# Patient Record
Sex: Male | Born: 1960
Health system: Southern US, Community
[De-identification: ages and names within clinical notes are randomized; demographics above are authoritative.]

## PROBLEM LIST (undated history)

## (undated) DIAGNOSIS — K219 Gastro-esophageal reflux disease without esophagitis: Secondary | ICD-10-CM

## (undated) DIAGNOSIS — R011 Cardiac murmur, unspecified: Secondary | ICD-10-CM

## (undated) DIAGNOSIS — S43439A Superior glenoid labrum lesion of unspecified shoulder, initial encounter: Secondary | ICD-10-CM

## (undated) DIAGNOSIS — K2270 Barrett's esophagus without dysplasia: Secondary | ICD-10-CM

## (undated) DIAGNOSIS — I10 Essential (primary) hypertension: Secondary | ICD-10-CM

## (undated) DIAGNOSIS — Z973 Presence of spectacles and contact lenses: Secondary | ICD-10-CM

## (undated) HISTORY — PX: COLONOSCOPY: SHX174

## (undated) HISTORY — PX: UPPER GASTROINTESTINAL ENDOSCOPY: SHX188

## (undated) HISTORY — DX: Cardiac murmur, unspecified: R01.1

## (undated) HISTORY — DX: Barrett's esophagus without dysplasia: K22.70

## (undated) HISTORY — PX: LIPOMA EXCISION: SHX5283

## (undated) HISTORY — PX: TONSILLECTOMY: SUR1361

## (undated) HISTORY — PX: APPENDECTOMY: SHX54

## (undated) HISTORY — DX: Superior glenoid labrum lesion of unspecified shoulder, initial encounter: S43.439A

---

## 1998-11-20 ENCOUNTER — Emergency Department (HOSPITAL_COMMUNITY): Admission: EM | Admit: 1998-11-20 | Discharge: 1998-11-20 | Payer: Self-pay | Admitting: *Deleted

## 2000-06-04 ENCOUNTER — Other Ambulatory Visit: Admission: RE | Admit: 2000-06-04 | Discharge: 2000-06-04 | Payer: Self-pay | Admitting: Urology

## 2001-10-15 ENCOUNTER — Encounter: Payer: Self-pay | Admitting: Emergency Medicine

## 2001-10-15 ENCOUNTER — Emergency Department (HOSPITAL_COMMUNITY): Admission: EM | Admit: 2001-10-15 | Discharge: 2001-10-15 | Payer: Self-pay | Admitting: Emergency Medicine

## 2012-09-18 ENCOUNTER — Ambulatory Visit (INDEPENDENT_AMBULATORY_CARE_PROVIDER_SITE_OTHER): Payer: 59 | Admitting: General Surgery

## 2012-09-18 ENCOUNTER — Encounter (INDEPENDENT_AMBULATORY_CARE_PROVIDER_SITE_OTHER): Payer: Self-pay | Admitting: General Surgery

## 2012-09-18 VITALS — BP 134/84 | HR 62 | Temp 97.5°F | Resp 16 | Ht 75.0 in | Wt 229.0 lb

## 2012-09-18 DIAGNOSIS — D170 Benign lipomatous neoplasm of skin and subcutaneous tissue of head, face and neck: Secondary | ICD-10-CM | POA: Insufficient documentation

## 2012-09-18 DIAGNOSIS — D1739 Benign lipomatous neoplasm of skin and subcutaneous tissue of other sites: Secondary | ICD-10-CM

## 2012-09-18 NOTE — Progress Notes (Signed)
Patient ID: Christian Nunez, male   DOB: 19-Nov-1960, 52 y.o.   MRN: [id number]  Chief Complaint  Patient presents with  . New Evaluation    eval cyst on back    HPI [name] is a 52 y.o. male.   HPI  He is self-referred with a new problem. He had a previous lipoma taken off of his neck. He is felt a small nodule in that same area that is progressively enlarging. No pain.  Past Medical History  Diagnosis Date  . Heart murmur     Past Surgical History  Procedure Laterality Date  . Tonsillectomy    . Appendectomy      History reviewed. No pertinent family history.  Social History History  Substance Use Topics  . Smoking status: Never Smoker   . Smokeless tobacco: Not on file  . Alcohol Use: Yes     Comment: 2 week    No Known Allergies  Current Outpatient Prescriptions  Medication Sig Dispense Refill  . omeprazole (PRILOSEC) 20 MG capsule Take 20 mg by mouth daily.       No current facility-administered medications for this visit.    Review of Systems Review of Systems  Constitutional:       He has been exercising and has lost some weight.  Respiratory: Negative.   Cardiovascular: Negative.     Blood pressure 134/84, pulse 62, temperature 97.5 F (36.4 C), temperature source Temporal, resp. rate 16, height 6\' 3"  (1.905 m), weight 229 lb (103.874 kg), SpO2 98.00%.  Physical Exam Physical Exam  Constitutional: He appears well-developed and well-nourished. No distress.  Neck:  Small scar in upper back/left inferior neck. At the medial aspect of the scar is a 1 cm movable nodule. No erythema there.  Lymphadenopathy:    He has no cervical adenopathy.    Data Reviewed Old note and pathology report.  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     Plan    Excision under local anesthesia. The procedure and risks were discussed with him. Risks include but not limited to bleeding, infection, would problems, indentation at the site. He  seemed to understand and agrees with the plan.        Choice Kleinsasser J 09/18/2012, 12:30 PM

## 2012-09-18 NOTE — Patient Instructions (Signed)
May do most activities as tolerated after procedure.

## 2012-10-03 ENCOUNTER — Other Ambulatory Visit (INDEPENDENT_AMBULATORY_CARE_PROVIDER_SITE_OTHER): Payer: Self-pay | Admitting: *Deleted

## 2012-10-03 ENCOUNTER — Other Ambulatory Visit (INDEPENDENT_AMBULATORY_CARE_PROVIDER_SITE_OTHER): Payer: Self-pay | Admitting: General Surgery

## 2012-10-03 DIAGNOSIS — D1739 Benign lipomatous neoplasm of skin and subcutaneous tissue of other sites: Secondary | ICD-10-CM

## 2012-10-03 MED ORDER — TRAMADOL HCL 50 MG PO TABS: 50.0000 mg | ORAL_TABLET | Freq: Four times a day (QID) | ORAL | Status: DC | PRN

## 2012-10-25 ENCOUNTER — Encounter (INDEPENDENT_AMBULATORY_CARE_PROVIDER_SITE_OTHER): Payer: 59 | Admitting: General Surgery

## 2012-11-04 ENCOUNTER — Encounter (INDEPENDENT_AMBULATORY_CARE_PROVIDER_SITE_OTHER): Payer: Self-pay | Admitting: General Surgery

## 2012-12-30 ENCOUNTER — Encounter (HOSPITAL_BASED_OUTPATIENT_CLINIC_OR_DEPARTMENT_OTHER): Payer: Self-pay | Admitting: *Deleted

## 2012-12-30 NOTE — Progress Notes (Signed)
No labs needed

## 2013-01-07 ENCOUNTER — Ambulatory Visit (HOSPITAL_BASED_OUTPATIENT_CLINIC_OR_DEPARTMENT_OTHER): Payer: 59 | Admitting: Anesthesiology

## 2013-01-07 ENCOUNTER — Encounter (HOSPITAL_BASED_OUTPATIENT_CLINIC_OR_DEPARTMENT_OTHER): Payer: Self-pay

## 2013-01-07 ENCOUNTER — Encounter (HOSPITAL_BASED_OUTPATIENT_CLINIC_OR_DEPARTMENT_OTHER)
Admission: RE | Disposition: A | Discharge status: Home / Self Care | Payer: Self-pay | Source: Ambulatory Visit | Attending: Orthopedic Surgery

## 2013-01-07 ENCOUNTER — Other Ambulatory Visit: Payer: Self-pay | Admitting: Physician Assistant

## 2013-01-07 ENCOUNTER — Encounter: Payer: Self-pay | Admitting: Physician Assistant

## 2013-01-07 ENCOUNTER — Encounter (HOSPITAL_BASED_OUTPATIENT_CLINIC_OR_DEPARTMENT_OTHER): Payer: 59 | Admitting: Anesthesiology

## 2013-01-07 ENCOUNTER — Ambulatory Visit (HOSPITAL_BASED_OUTPATIENT_CLINIC_OR_DEPARTMENT_OTHER)
Admission: RE | Admit: 2013-01-07 | Discharge: 2013-01-07 | Disposition: A | Discharge status: Home / Self Care | Payer: 59 | Source: Ambulatory Visit | Attending: Orthopedic Surgery | Admitting: Orthopedic Surgery

## 2013-01-07 DIAGNOSIS — X500XXA Overexertion from strenuous movement or load, initial encounter: Secondary | ICD-10-CM | POA: Insufficient documentation

## 2013-01-07 DIAGNOSIS — S43439A Superior glenoid labrum lesion of unspecified shoulder, initial encounter: Secondary | ICD-10-CM | POA: Insufficient documentation

## 2013-01-07 DIAGNOSIS — S43432D Superior glenoid labrum lesion of left shoulder, subsequent encounter: Secondary | ICD-10-CM

## 2013-01-07 DIAGNOSIS — K219 Gastro-esophageal reflux disease without esophagitis: Secondary | ICD-10-CM | POA: Diagnosis present

## 2013-01-07 DIAGNOSIS — R011 Cardiac murmur, unspecified: Secondary | ICD-10-CM | POA: Diagnosis present

## 2013-01-07 DIAGNOSIS — S43431D Superior glenoid labrum lesion of right shoulder, subsequent encounter: Secondary | ICD-10-CM

## 2013-01-07 DIAGNOSIS — M9420 Chondromalacia, unspecified site: Secondary | ICD-10-CM | POA: Insufficient documentation

## 2013-01-07 DIAGNOSIS — Y93B9 Activity, other involving muscle strengthening exercises: Secondary | ICD-10-CM | POA: Insufficient documentation

## 2013-01-07 DIAGNOSIS — S43429A Sprain of unspecified rotator cuff capsule, initial encounter: Secondary | ICD-10-CM | POA: Insufficient documentation

## 2013-01-07 HISTORY — DX: Gastro-esophageal reflux disease without esophagitis: K21.9

## 2013-01-07 HISTORY — DX: Presence of spectacles and contact lenses: Z97.3

## 2013-01-07 HISTORY — PX: SHOULDER ARTHROSCOPY WITH LABRAL REPAIR: SHX5691

## 2013-01-07 SURGERY — ARTHROSCOPY, SHOULDER, WITH GLENOID LABRUM REPAIR
Anesthesia: Regional | Site: Shoulder | Laterality: Right

## 2013-01-07 MED ORDER — MIDAZOLAM HCL 2 MG/2ML IJ SOLN
1.0000 mg | INTRAMUSCULAR | Status: DC | PRN
  Administered 2013-01-07: 2 mg via INTRAVENOUS

## 2013-01-07 MED ORDER — MIDAZOLAM HCL 2 MG/2ML IJ SOLN
INTRAMUSCULAR | Status: AC
  Filled 2013-01-07: qty 2

## 2013-01-07 MED ORDER — SUCCINYLCHOLINE CHLORIDE 20 MG/ML IJ SOLN
INTRAMUSCULAR | Status: DC | PRN
  Administered 2013-01-07: 100 mg via INTRAVENOUS

## 2013-01-07 MED ORDER — FENTANYL CITRATE 0.05 MG/ML IJ SOLN
INTRAMUSCULAR | Status: AC
  Filled 2013-01-07: qty 2

## 2013-01-07 MED ORDER — EPINEPHRINE HCL 1 MG/ML IJ SOLN
INTRAMUSCULAR | Status: AC
  Filled 2013-01-07: qty 1

## 2013-01-07 MED ORDER — DEXAMETHASONE SODIUM PHOSPHATE 4 MG/ML IJ SOLN
INTRAMUSCULAR | Status: DC | PRN
  Administered 2013-01-07: 10 mg via INTRAVENOUS

## 2013-01-07 MED ORDER — EPINEPHRINE HCL 1 MG/ML IJ SOLN
INTRAMUSCULAR | Status: DC | PRN
  Administered 2013-01-07: 2 mL

## 2013-01-07 MED ORDER — BUPIVACAINE-EPINEPHRINE PF 0.5-1:200000 % IJ SOLN
INTRAMUSCULAR | Status: DC | PRN
  Administered 2013-01-07: 30 mL via PERINEURAL

## 2013-01-07 MED ORDER — ONDANSETRON HCL 4 MG/2ML IJ SOLN
INTRAMUSCULAR | Status: DC | PRN
  Administered 2013-01-07: 4 mg via INTRAVENOUS

## 2013-01-07 MED ORDER — FENTANYL CITRATE 0.05 MG/ML IJ SOLN
INTRAMUSCULAR | Status: AC
  Filled 2013-01-07: qty 6

## 2013-01-07 MED ORDER — DIAZEPAM 2 MG PO TABS: 2.0000 mg | ORAL_TABLET | Freq: Four times a day (QID) | ORAL | Status: DC | PRN

## 2013-01-07 MED ORDER — CEFAZOLIN SODIUM-DEXTROSE 2-3 GM-% IV SOLR
2.0000 g | INTRAVENOUS | Status: AC
  Administered 2013-01-07: 2 g via INTRAVENOUS

## 2013-01-07 MED ORDER — FENTANYL CITRATE 0.05 MG/ML IJ SOLN
50.0000 ug | INTRAMUSCULAR | Status: DC | PRN
  Administered 2013-01-07: 100 ug via INTRAVENOUS

## 2013-01-07 MED ORDER — BUPIVACAINE-EPINEPHRINE PF 0.25-1:200000 % IJ SOLN
INTRAMUSCULAR | Status: AC
  Filled 2013-01-07: qty 30

## 2013-01-07 MED ORDER — OXYCODONE HCL 5 MG PO TABS: 5.0000 mg | ORAL_TABLET | Freq: Once | ORAL | Status: DC | PRN

## 2013-01-07 MED ORDER — OXYCODONE HCL 5 MG/5ML PO SOLN: 5.0000 mg | Freq: Once | ORAL | Status: DC | PRN

## 2013-01-07 MED ORDER — CHLORHEXIDINE GLUCONATE 4 % EX LIQD: 60.0000 mL | Freq: Once | CUTANEOUS | Status: DC

## 2013-01-07 MED ORDER — PROPOFOL 10 MG/ML IV BOLUS
INTRAVENOUS | Status: DC | PRN
  Administered 2013-01-07: 250 mg via INTRAVENOUS

## 2013-01-07 MED ORDER — LIDOCAINE HCL (CARDIAC) 20 MG/ML IV SOLN
INTRAVENOUS | Status: DC | PRN
  Administered 2013-01-07: 75 mg via INTRAVENOUS

## 2013-01-07 MED ORDER — FENTANYL CITRATE 0.05 MG/ML IJ SOLN
INTRAMUSCULAR | Status: DC | PRN
  Administered 2013-01-07 (×2): 50 ug via INTRAVENOUS

## 2013-01-07 MED ORDER — OXYCODONE HCL 5 MG PO TABS: ORAL_TABLET | ORAL | Status: DC

## 2013-01-07 MED ORDER — SODIUM CHLORIDE 0.9 % IR SOLN
Status: DC | PRN
  Administered 2013-01-07: 6000 mL

## 2013-01-07 MED ORDER — LACTATED RINGERS IV SOLN
INTRAVENOUS | Status: DC
  Administered 2013-01-07 (×2): via INTRAVENOUS

## 2013-01-07 MED ORDER — HYDROMORPHONE HCL PF 1 MG/ML IJ SOLN: 0.2500 mg | INTRAMUSCULAR | Status: DC | PRN

## 2013-01-07 SURGICAL SUPPLY — 83 items
ANCH SUT SHRT 12.5 CANN EYLT (Anchor) ×6 IMPLANT
ANCHOR SUT BIOCOMP LK 2.9X12.5 (Anchor) ×6 IMPLANT
APL SKNCLS STERI-STRIP NONHPOA (GAUZE/BANDAGES/DRESSINGS)
BENZOIN TINCTURE PRP APPL 2/3 (GAUZE/BANDAGES/DRESSINGS) IMPLANT
BLADE CUDA 5.5 (BLADE) IMPLANT
BLADE CUTTER GATOR 3.5 (BLADE) ×3 IMPLANT
BLADE GREAT WHITE 4.2 (BLADE) IMPLANT
BLADE SURG 15 STRL LF DISP TIS (BLADE) IMPLANT
BLADE SURG 15 STRL SS (BLADE)
BLADE SURG ROTATE 9660 (MISCELLANEOUS) IMPLANT
BNDG COHESIVE 4X5 TAN STRL (GAUZE/BANDAGES/DRESSINGS) ×3 IMPLANT
BUR OVAL 6.0 (BURR) IMPLANT
CANISTER SUCT 3000ML (MISCELLANEOUS) IMPLANT
CANISTER SUCT LVC 12 LTR MEDI- (MISCELLANEOUS) ×3 IMPLANT
CANNULA TWIST IN 8.25X7CM (CANNULA) IMPLANT
DECANTER SPIKE VIAL GLASS SM (MISCELLANEOUS) IMPLANT
DRAPE SHOULDER BEACH CHAIR (DRAPES) ×3 IMPLANT
DRAPE U-SHAPE 47X51 STRL (DRAPES) ×6 IMPLANT
DRSG PAD ABDOMINAL 8X10 ST (GAUZE/BANDAGES/DRESSINGS) ×3 IMPLANT
DURAPREP 26ML APPLICATOR (WOUND CARE) ×3 IMPLANT
ELECT REM PT RETURN 9FT ADLT (ELECTROSURGICAL) ×3
ELECTRODE REM PT RTRN 9FT ADLT (ELECTROSURGICAL) ×2 IMPLANT
GAUZE XEROFORM 1X8 LF (GAUZE/BANDAGES/DRESSINGS) ×3 IMPLANT
GLOVE BIO SURGEON STRL SZ 6.5 (GLOVE) ×2 IMPLANT
GLOVE BIO SURGEON STRL SZ7 (GLOVE) IMPLANT
GLOVE BIOGEL PI IND STRL 7.0 (GLOVE) ×1 IMPLANT
GLOVE BIOGEL PI IND STRL 7.5 (GLOVE) ×2 IMPLANT
GLOVE BIOGEL PI INDICATOR 7.0 (GLOVE) ×1
GLOVE BIOGEL PI INDICATOR 7.5 (GLOVE) ×1
GLOVE SS BIOGEL STRL SZ 7.5 (GLOVE) ×2 IMPLANT
GLOVE SUPERSENSE BIOGEL SZ 7.5 (GLOVE) ×1
GOWN PREVENTION PLUS XLARGE (GOWN DISPOSABLE) ×3 IMPLANT
KIT PUSHLOCK 2.9 HIP (KITS) ×2 IMPLANT
LASSO CRESCENT QUICKPASS (SUTURE) ×3 IMPLANT
LASSO SUT 90 DEGREE (SUTURE) IMPLANT
LOOP 2 FIBERLINK CLOSED (SUTURE) IMPLANT
NDL 1/2 CIR CATGUT .05X1.09 (NEEDLE) IMPLANT
NDL SAFETY ECLIPSE 18X1.5 (NEEDLE) ×2 IMPLANT
NDL SUT 6 .5 CRC .975X.05 MAYO (NEEDLE) IMPLANT
NEEDLE 1/2 CIR CATGUT .05X1.09 (NEEDLE) IMPLANT
NEEDLE HYPO 18GX1.5 SHARP (NEEDLE) ×3
NEEDLE MAYO TAPER (NEEDLE)
PACK ARTHROSCOPY DSU (CUSTOM PROCEDURE TRAY) ×3 IMPLANT
PACK BASIN DAY SURGERY FS (CUSTOM PROCEDURE TRAY) ×3 IMPLANT
PAD ALCOHOL SWAB (MISCELLANEOUS) ×6 IMPLANT
PENCIL BUTTON HOLSTER BLD 10FT (ELECTRODE) IMPLANT
SET ARTHROSCOPY TUBING (MISCELLANEOUS) ×3
SET ARTHROSCOPY TUBING LN (MISCELLANEOUS) ×2 IMPLANT
SHEET MEDIUM DRAPE 40X70 STRL (DRAPES) IMPLANT
SLEEVE SCD COMPRESS KNEE MED (MISCELLANEOUS) IMPLANT
SLING ARM FOAM STRAP LRG (SOFTGOODS) IMPLANT
SLING ARM FOAM STRAP MED (SOFTGOODS) IMPLANT
SLING ARM FOAM STRAP XLG (SOFTGOODS) IMPLANT
SLING ARM IMMOBILIZER MED (SOFTGOODS) IMPLANT
SLING ULTRA III MED (ORTHOPEDIC SUPPLIES) IMPLANT
SPONGE GAUZE 4X4 12PLY (GAUZE/BANDAGES/DRESSINGS) ×3 IMPLANT
SPONGE LAP 4X18 X RAY DECT (DISPOSABLE) IMPLANT
STRIP CLOSURE SKIN 1/2X4 (GAUZE/BANDAGES/DRESSINGS) IMPLANT
SUCTION FRAZIER TIP 10 FR DISP (SUCTIONS) IMPLANT
SUT ETHILON 3 0 PS 1 (SUTURE) ×3 IMPLANT
SUT FIBERWIRE #2 38 T-5 BLUE (SUTURE)
SUT LASSO 45 DEG R (SUTURE) IMPLANT
SUT LASSO 45 DEGREE LEFT (SUTURE) IMPLANT
SUT LASSO MICRO STR (SUTURE) ×2 IMPLANT
SUT PDS AB 2-0 CT2 27 (SUTURE) IMPLANT
SUT PROLENE 3 0 PS 2 (SUTURE) IMPLANT
SUT TIGER TAPE 7 IN WHITE (SUTURE) IMPLANT
SUT VIC AB 0 SH 27 (SUTURE) IMPLANT
SUT VIC AB 2-0 PS2 27 (SUTURE) IMPLANT
SUT VIC AB 2-0 SH 27 (SUTURE)
SUT VIC AB 2-0 SH 27XBRD (SUTURE) IMPLANT
SUTURE FIBERWR #2 38 T-5 BLUE (SUTURE) IMPLANT
SYR 20CC LL (SYRINGE) IMPLANT
SYR 5ML LL (SYRINGE) ×3 IMPLANT
SYR BULB 3OZ (MISCELLANEOUS) IMPLANT
TAPE FIBER 2MM 7IN #2 BLUE (SUTURE) IMPLANT
TAPE HYPAFIX 6X30 (GAUZE/BANDAGES/DRESSINGS) IMPLANT
TAPE LABRALWHITE 1.5X36 (TAPE) ×6 IMPLANT
TAPE STRIPS DRAPE STRL (GAUZE/BANDAGES/DRESSINGS) ×3 IMPLANT
TOWEL OR 17X24 6PK STRL BLUE (TOWEL DISPOSABLE) ×3 IMPLANT
TUBE CONNECTING 20X1/4 (TUBING) IMPLANT
WAND STAR VAC 90 (SURGICAL WAND) IMPLANT
WATER STERILE IRR 1000ML POUR (IV SOLUTION) ×3 IMPLANT

## 2013-01-07 NOTE — Anesthesia Postprocedure Evaluation (Signed)
  Anesthesia Post-op Note  Patient: Christian Nunez  Procedure(s) Performed: Procedure(s): SHOULDER ARTHROSCOPY WITH LABRAL REPAIR AND PARTIAL ROTATOR CUFF DEBRIDMENT (Right)  Patient Location: PACU  Anesthesia Type:GA combined with regional for post-op pain  Level of Consciousness: awake  Airway and Oxygen Therapy: Patient Spontanous Breathing  Post-op Pain: mild  Post-op Assessment: Post-op Vital signs reviewed, Patient's Cardiovascular Status Stable, Respiratory Function Stable, Patent Airway, No signs of Nausea or vomiting and Pain level controlled  Post-op Vital Signs: Reviewed and stable  Complications: No apparent anesthesia complications

## 2013-01-07 NOTE — Anesthesia Preprocedure Evaluation (Addendum)
Anesthesia Evaluation  Patient identified by MRN, date of birth, ID band Patient awake    Reviewed: Allergy & Precautions, H&P , NPO status , Patient's Chart, lab work & pertinent test results  Airway Mallampati: II TM Distance: >3 FB Neck ROM: Full    Dental no notable dental hx. (+) Teeth Intact and Dental Advisory Given   Pulmonary neg pulmonary ROS,  breath sounds clear to auscultation  Pulmonary exam normal       Cardiovascular negative cardio ROS  Rhythm:Regular Rate:Normal     Neuro/Psych negative neurological ROS  negative psych ROS   GI/Hepatic Neg liver ROS, GERD-  Medicated and Controlled,  Endo/Other  negative endocrine ROS  Renal/GU negative Renal ROS  negative genitourinary   Musculoskeletal   Abdominal   Peds  Hematology negative hematology ROS (+)   Anesthesia Other Findings   Reproductive/Obstetrics negative OB ROS                          Anesthesia Physical Anesthesia Plan  ASA: II  Anesthesia Plan: General and Regional   Post-op Pain Management:    Induction: Intravenous  Airway Management Planned: Oral ETT  Additional Equipment:   Intra-op Plan:   Post-operative Plan: Extubation in OR  Informed Consent: I have reviewed the patients History and Physical, chart, labs and discussed the procedure including the risks, benefits and alternatives for the proposed anesthesia with the patient or authorized representative who has indicated his/her understanding and acceptance.   Dental advisory given  Plan Discussed with: CRNA and Surgeon  Anesthesia Plan Comments:         Anesthesia Quick Evaluation

## 2013-01-07 NOTE — Interval H&P Note (Signed)
History and Physical Interval Note:  01/07/2013 12:05 PM  Christian Nunez  has presented today for surgery, with the diagnosis of RIGHT SHOULDER IMPINGEMENT SYNDROME, SPRAIN/STRAIN/TEAR SLAP LESION, BICIPITAL TENOSYNOVITIS  The various methods of treatment have been discussed with the patient and family. After consideration of risks, benefits and other options for treatment, the patient has consented to  Procedure(s) with comments: RIGHT ARTHROSCOPY SHOULDER DEBRIDEMENT EXTENTSIVE, ARTHROSCOPY SHOULDER WITH BICEP TENODESIS (Right) - ANESTHESIA: GENERAL, PRE/POST OP SCALENE as a surgical intervention .  The patient's history has been reviewed, patient examined, no change in status, stable for surgery.  I have reviewed the patient's chart and labs.  Questions were answered to the patient's satisfaction.     Salvatore Marvel A

## 2013-01-07 NOTE — Transfer of Care (Signed)
Immediate Anesthesia Transfer of Care Note  Patient: Christian Nunez  Procedure(s) Performed: Procedure(s): SHOULDER ARTHROSCOPY WITH LABRAL REPAIR AND PARTIAL ROTATOR CUFF DEBRIDMENT (Right)  Patient Location: PACU  Anesthesia Type:General and Regional  Level of Consciousness: awake and alert   Airway & Oxygen Therapy: Patient Spontanous Breathing and Patient connected to face mask oxygen  Post-op Assessment: Report given to PACU RN and Post -op Vital signs reviewed and stable  Post vital signs: Reviewed and stable  Complications: No apparent anesthesia complications

## 2013-01-07 NOTE — Progress Notes (Signed)
Assisted Dr. Fitzgerald with right, ultrasound guided, supraclavicular block. Side rails up, monitors on throughout procedure. See vital signs in flow sheet. Tolerated Procedure well. 

## 2013-01-07 NOTE — Anesthesia Procedure Notes (Addendum)
Anesthesia Regional Block:  Interscalene brachial plexus block  Pre-Anesthetic Checklist: ,, timeout performed, Correct Patient, Correct Site, Correct Laterality, Correct Procedure, Correct Position, site marked, Risks and benefits discussed, pre-op evaluation,  At surgeon's request and post-op pain management  Laterality: Right  Prep: Maximum Sterile Barrier Precautions used and chloraprep       Needles:  Injection technique: Single-shot  Needle Type: Echogenic Stimulator Needle     Needle Length: 5cm 5 cm Needle Gauge: 22 and 22 G    Additional Needles:  Procedures: ultrasound guided (picture in chart) and nerve stimulator Interscalene brachial plexus block  Nerve Stimulator or Paresthesia:  Response: Biceps response,   Additional Responses:   Narrative:  Start time: 01/07/2013 11:30 AM End time: 01/07/2013 11:40 AM Injection made incrementally with aspirations every 5 mL. Anesthesiologist: Sampson Goon, MD  Additional Notes: 2% Lidocaine skin wheel.    Procedure Name: Intubation Date/Time: 01/07/2013 12:17 PM Performed by: Zenia Resides D Pre-anesthesia Checklist: Patient identified, Emergency Drugs available, Suction available and Patient being monitored Patient Re-evaluated:Patient Re-evaluated prior to inductionOxygen Delivery Method: Circle System Utilized Preoxygenation: Pre-oxygenation with 100% oxygen Intubation Type: IV induction Ventilation: Mask ventilation without difficulty Laryngoscope Size: Mac and 3 Grade View: Grade I Tube type: Oral Tube size: 7.0 mm Number of attempts: 1 Airway Equipment and Method: stylet and oral airway Placement Confirmation: ETT inserted through vocal cords under direct vision,  positive ETCO2 and breath sounds checked- equal and bilateral Secured at: 23 cm Tube secured with: Tape Dental Injury: Teeth and Oropharynx as per pre-operative assessment

## 2013-01-07 NOTE — H&P (Signed)
Christian Nunez is an 52 y.o. male.   Chief Complaint: right shoulder pain  HPI: Christian Nunez is a 52 year old seen for evaluation of a right shoulder injury that occurred while doing cross fit exercise. He had some pain while doing upper lifting and it got much worse 2 days later and he has pain with any upper body lifting. We injected his shoulder two month ago which only helped for 2-3 days and then pain recurred. He has night pain and pain during the day as well. He cannot sleep because of the pain. He has been on Mobic with minimal relief. MRI arthrogram that revealed a large SLAP tear  Past Medical History  Diagnosis Date  . Wears glasses     reading  . Heart murmur     echo in college-do not hear now  . GERD (gastroesophageal reflux disease)   . SLAP (superior labrum from anterior to posterior) tear     Past Surgical History  Procedure Laterality Date  . Tonsillectomy    . Appendectomy    . Lipoma excision      No family history on file. Social History:  reports that he has never smoked. He does not have any smokeless tobacco history on file. He reports that he drinks alcohol. He reports that he does not use illicit drugs.  Allergies: No Known Allergies  Current Outpatient Prescriptions on File Prior to Visit  Medication Sig Dispense Refill  . HYDROcodone-acetaminophen (NORCO/VICODIN) 5-325 MG per tablet Take 1 tablet by mouth every 6 (six) hours as needed for moderate pain.      Marland Kitchen omeprazole (PRILOSEC) 20 MG capsule Take 20 mg by mouth daily.       No current facility-administered medications on file prior to visit.    (Not in a hospital admission)  No results found for this or any previous visit (from the past 48 hour(s)). No results found.  Review of Systems  Constitutional: Negative.   HENT: Negative.   Eyes: Negative.        Wears glasses  Respiratory: Negative.   Cardiovascular: Negative.   Gastrointestinal: Negative.   Genitourinary: Negative.   Musculoskeletal:  Positive for joint pain.       Right shoulder pain  Skin: Negative.   Neurological: Negative.   Endo/Heme/Allergies: Negative.   Psychiatric/Behavioral: Negative.     Height 6\' 4"  (1.93 m), weight 98.884 kg (218 lb). Physical Exam  Constitutional: He is oriented to person, place, and time. He appears well-developed and well-nourished.  HENT:  Head: Normocephalic and atraumatic.  Mouth/Throat: Oropharynx is clear and moist.  Eyes: Conjunctivae are normal. Pupils are equal, round, and reactive to light.  Neck: Neck supple.  Cardiovascular: Normal rate and regular rhythm.   Murmur heard. Respiratory: Effort normal.  GI: Soft.  Genitourinary:  Not pertinent to current symptomatology therefore not examined.  Musculoskeletal:  Examination of his right shoulder reveals pain anteriorly and laterally full range of motion pain on rotator cuff stressing but minimal weakness no instability. Mildly positive O'Brien's test. Exam of the left shoulder reveals full range of motion without pain weakness or instability. Vascular exam: pulses 2+ and symmetric.  Neurological: He is alert and oriented to person, place, and time.  Skin: Skin is warm and dry.  Psychiatric: He has a normal mood and affect. His behavior is normal.     Assessment Patient Active Problem List   Diagnosis Date Noted  . Heart murmur   . GERD (gastroesophageal reflux disease)   .  SLAP (superior labrum from anterior to posterior) tear   . Lipoma of neck 09/18/2012    Plan I spoke to [facility] concerning his right shoulder MRI arthrogram that revealed a large SLAP tear. I told him with this finding and his significant pain that I recommend we proceed with a right shoulder arthroscopy with debridement versus biceps tenodesis. Discussed risks benefits and possible complications of the surgery in detail and he understands this completely.   Christian Nunez 01/07/2013, 10:01 AM

## 2013-01-07 NOTE — H&P (View-Only) (Signed)
Christian Nunez is an 52 y.o. male.   Chief Complaint: right shoulder pain  HPI: Christian Nunez is a 52 year old seen for evaluation of a right shoulder injury that occurred while doing cross fit exercise. He had some pain while doing upper lifting and it got much worse 2 days later and he has pain with any upper body lifting. We injected his shoulder two month ago which only helped for 2-3 days and then pain recurred. He has night pain and pain during the day as well. He cannot sleep because of the pain. He has been on Mobic with minimal relief. MRI arthrogram that revealed a large SLAP tear  Past Medical History  Diagnosis Date  . Wears glasses     reading  . Heart murmur     echo in college-do not hear now  . GERD (gastroesophageal reflux disease)   . SLAP (superior labrum from anterior to posterior) tear     Past Surgical History  Procedure Laterality Date  . Tonsillectomy    . Appendectomy    . Lipoma excision      No family history on file. Social History:  reports that he has never smoked. He does not have any smokeless tobacco history on file. He reports that he drinks alcohol. He reports that he does not use illicit drugs.  Allergies: No Known Allergies  Current Outpatient Prescriptions on File Prior to Visit  Medication Sig Dispense Refill  . HYDROcodone-acetaminophen (NORCO/VICODIN) 5-325 MG per tablet Take 1 tablet by mouth every 6 (six) hours as needed for moderate pain.      . omeprazole (PRILOSEC) 20 MG capsule Take 20 mg by mouth daily.       No current facility-administered medications on file prior to visit.    (Not in a hospital admission)  No results found for this or any previous visit (from the past 48 hour(s)). No results found.  Review of Systems  Constitutional: Negative.   HENT: Negative.   Eyes: Negative.        Wears glasses  Respiratory: Negative.   Cardiovascular: Negative.   Gastrointestinal: Negative.   Genitourinary: Negative.   Musculoskeletal:  Positive for joint pain.       Right shoulder pain  Skin: Negative.   Neurological: Negative.   Endo/Heme/Allergies: Negative.   Psychiatric/Behavioral: Negative.     Height 6' 4" (1.93 m), weight 98.884 kg (218 lb). Physical Exam  Constitutional: He is oriented to person, place, and time. He appears well-developed and well-nourished.  HENT:  Head: Normocephalic and atraumatic.  Mouth/Throat: Oropharynx is clear and moist.  Eyes: Conjunctivae are normal. Pupils are equal, round, and reactive to light.  Neck: Neck supple.  Cardiovascular: Normal rate and regular rhythm.   Murmur heard. Respiratory: Effort normal.  GI: Soft.  Genitourinary:  Not pertinent to current symptomatology therefore not examined.  Musculoskeletal:  Examination of his right shoulder reveals pain anteriorly and laterally full range of motion pain on rotator cuff stressing but minimal weakness no instability. Mildly positive O'Brien's test. Exam of the left shoulder reveals full range of motion without pain weakness or instability. Vascular exam: pulses 2+ and symmetric.  Neurological: He is alert and oriented to person, place, and time.  Skin: Skin is warm and dry.  Psychiatric: He has a normal mood and affect. His behavior is normal.     Assessment Patient Active Problem List   Diagnosis Date Noted  . Heart murmur   . GERD (gastroesophageal reflux disease)   .   SLAP (superior labrum from anterior to posterior) tear   . Lipoma of neck 09/18/2012    Plan I spoke to Jionni concerning his right shoulder MRI arthrogram that revealed a large SLAP tear. I told him with this finding and his significant pain that I recommend we proceed with a right shoulder arthroscopy with debridement versus biceps tenodesis. Discussed risks benefits and possible complications of the surgery in detail and he understands this completely.   Demarquez Ciolek J 01/07/2013, 10:01 AM    

## 2013-01-09 NOTE — Op Note (Signed)
NAMESANTI, TROUNG               ACCOUNT NO.:  [id number]  MEDICAL RECORD NO.:  [id number]  LOCATION:                               FACILITY:  [facility]  PHYSICIAN:  Albirtha Grinage A. Thurston Hole, M.D. DATE OF BIRTH:  September 19, 1960  DATE OF PROCEDURE:  01/07/2013 DATE OF DISCHARGE:  01/07/2013                              OPERATIVE REPORT   PREOPERATIVE DIAGNOSES: 1. Right shoulder posterior labrum tear. 2. Right shoulder superior labrum anterior and posterior tear. 3. Right shoulder chondromalacia. 4. Right shoulder partial rotator cuff tear.  POSTOPERATIVE DIAGNOSES: 1. Right shoulder posterior labrum tear. 2. Right shoulder superior labrum anterior and posterior tear. 3. Right shoulder chondromalacia. 4. Right shoulder partial rotator cuff tear.  PROCEDURE: 1. Right shoulder EUA followed by arthroscopically-assisted posterior     labrum repair using Arthrex PushLock anchors x2. 2. Right shoulder posterior SLAP repair using Arthrex PushLock anchor     x1. 3. Right shoulder chondroplasty. 4. Right shoulder partial rotator cuff tear debridement.  SURGEON:  Elana Alm. Thurston Hole, M.D.  ASSISTANT:  Kirstin Shepperson, PA-C.  ANESTHESIA:  General.  OPERATIVE TIME:  1 hour.  COMPLICATIONS:  None.  INDICATION FOR PROCEDURE:  Mr. Modica is a 52 year old gentleman who has had a significantly painful right shoulder with recurrent upper body weight lifting.  This has progressed over the past 3-4 months to be intolerable and exam and MRIs revealed a posterior labrum and posterior superior labrum tear with partial rotator cuff tear and chondromalacia. He has failed conservative care and is now to undergo arthroscopy.  DESCRIPTION:  Mr. Schappell was brought to the operating room on January 07, 2013, after an interscalene block was placed in the holding room by Anesthesia.  He was placed on the operative table in a supine position. He received antibiotics preoperatively for prophylaxis.  After  being placed under general anesthesia, his right shoulder was examined.  He had full range of motion, and his shoulder was stable to ligamentous exam.  He was then placed in beach-chair position and his shoulder and arm were prepped using sterile DuraPrep and draped using sterile technique.  Time-out procedure was called and the correct right shoulder identified.  Initially, through a posterior arthroscopic portal, the arthroscope with a pump attached was placed into an anterior portal and arthroscopic probe was placed.  On initial inspection, the articular cartilage in the glenohumeral joint showed 50% grade 3 chondromalacia on both the glenoid and humeral head and this was debrided.  There were loose articular cartilage pieces in the joint, which were debrided and removed.  The anterior labrum and anterior-inferior glenohumeral ligament complex was intact.  The superior labrum anteriorly was intact, but the superior labrum posteriorly was detached from the superior posterior glenoid rim and the posterior labrum was detached as well from the 7 o'clock up to the 11 o'clock position on the posterior labrum. The biceps tendon itself was found to be intact.  The rotator cuff showed partial tearing of the subscapularis 20%, which was debrided, and partial tearing of the midportion of the supraspinatus 25%, which was debrided, but it was well attached to the humeral head.  Inferior capsular recess was free of pathology.  At  this point, an accessory posterolateral portal was made and the posterior labrum tear was repaired first with an Arthrex PushLock anchor in the 7 o'clock position with a labral tape around the posterior inferior labrum and then the mid posterior labrum repaired with another Arthrex anchor in the 9 o'clock position with firm and tight fixation.  The posterior superior labrum portion of the SLAP tear was then repaired primarily with 1 Arthrex PushLock anchor in the 11 o'clock  position.  This completely restored the posterior labral stability.  Shoulder could be brought to a satisfactory range of motion with no excessive tension, but excellent stability on the repair.  At this point, the subacromial space was entered and moderately thickened bursitis was resected.  The rotator cuff on the bursal surface was found to be intact.  There was no significant impingement.  At this point, it was felt that all pathology had been satisfactorily addressed.  The instruments were removed. Portal was closed with 3-0 nylon suture.  Sterile dressings and a sling applied and the patient awakened and taken to the recovery room in a stable condition.  FOLLOWUP CARE:  Mr. Zylstra will be followed as an outpatient on OxyIR and Valium and an abduction sling.  Seen back in the office in a week for sutures out and followup.     Cabe Lashley A. Thurston Hole, M.D.   ______________________________ Elana Alm. Thurston Hole, M.D.    RAW/MEDQ  D:  01/07/2013  T:  01/08/2013  Job:  [id number]

## 2013-01-13 ENCOUNTER — Encounter (HOSPITAL_BASED_OUTPATIENT_CLINIC_OR_DEPARTMENT_OTHER): Payer: Self-pay | Admitting: Orthopedic Surgery

## 2013-08-26 ENCOUNTER — Other Ambulatory Visit: Payer: Self-pay | Admitting: Orthopedic Surgery

## 2013-08-26 DIAGNOSIS — M25511 Pain in right shoulder: Secondary | ICD-10-CM

## 2013-09-03 ENCOUNTER — Ambulatory Visit
Admission: RE | Admit: 2013-09-03 | Discharge: 2013-09-03 | Disposition: A | Discharge status: Home / Self Care | Payer: 59 | Source: Ambulatory Visit | Attending: Orthopedic Surgery | Admitting: Orthopedic Surgery

## 2013-09-03 DIAGNOSIS — M25511 Pain in right shoulder: Secondary | ICD-10-CM

## 2013-09-03 MED ORDER — IOHEXOL 180 MG/ML  SOLN
15.0000 mL | Freq: Once | INTRAMUSCULAR | Status: AC | PRN
  Administered 2013-09-03: 15 mL via INTRA_ARTICULAR

## 2015-05-12 ENCOUNTER — Other Ambulatory Visit: Payer: Self-pay | Admitting: Gastroenterology

## 2015-06-09 ENCOUNTER — Encounter (HOSPITAL_COMMUNITY): Payer: Self-pay | Admitting: *Deleted

## 2015-06-14 ENCOUNTER — Encounter (HOSPITAL_COMMUNITY): Payer: Self-pay

## 2015-06-14 ENCOUNTER — Ambulatory Visit (HOSPITAL_COMMUNITY): Payer: 59 | Admitting: Anesthesiology

## 2015-06-14 ENCOUNTER — Ambulatory Visit (HOSPITAL_COMMUNITY)
Admission: RE | Admit: 2015-06-14 | Discharge: 2015-06-14 | Disposition: A | Discharge status: Home / Self Care | Payer: 59 | Source: Ambulatory Visit | Attending: Gastroenterology | Admitting: Gastroenterology

## 2015-06-14 ENCOUNTER — Encounter (HOSPITAL_COMMUNITY)
Admission: RE | Disposition: A | Discharge status: Home / Self Care | Payer: Self-pay | Source: Ambulatory Visit | Attending: Gastroenterology

## 2015-06-14 DIAGNOSIS — Z1211 Encounter for screening for malignant neoplasm of colon: Secondary | ICD-10-CM | POA: Diagnosis not present

## 2015-06-14 DIAGNOSIS — F329 Major depressive disorder, single episode, unspecified: Secondary | ICD-10-CM | POA: Insufficient documentation

## 2015-06-14 DIAGNOSIS — Z8371 Family history of colonic polyps: Secondary | ICD-10-CM | POA: Diagnosis not present

## 2015-06-14 DIAGNOSIS — E7800 Pure hypercholesterolemia, unspecified: Secondary | ICD-10-CM | POA: Diagnosis not present

## 2015-06-14 DIAGNOSIS — K219 Gastro-esophageal reflux disease without esophagitis: Secondary | ICD-10-CM | POA: Diagnosis not present

## 2015-06-14 DIAGNOSIS — K317 Polyp of stomach and duodenum: Secondary | ICD-10-CM | POA: Diagnosis not present

## 2015-06-14 DIAGNOSIS — K2270 Barrett's esophagus without dysplasia: Secondary | ICD-10-CM | POA: Diagnosis present

## 2015-06-14 DIAGNOSIS — K208 Other esophagitis: Secondary | ICD-10-CM | POA: Diagnosis not present

## 2015-06-14 DIAGNOSIS — I341 Nonrheumatic mitral (valve) prolapse: Secondary | ICD-10-CM | POA: Diagnosis not present

## 2015-06-14 HISTORY — PX: ESOPHAGOGASTRODUODENOSCOPY (EGD) WITH PROPOFOL: SHX5813

## 2015-06-14 HISTORY — DX: Essential (primary) hypertension: I10

## 2015-06-14 HISTORY — PX: COLONOSCOPY WITH PROPOFOL: SHX5780

## 2015-06-14 SURGERY — COLONOSCOPY WITH PROPOFOL
Anesthesia: Monitor Anesthesia Care

## 2015-06-14 MED ORDER — PROPOFOL 10 MG/ML IV BOLUS
INTRAVENOUS | Status: AC
  Filled 2015-06-14: qty 20

## 2015-06-14 MED ORDER — PROPOFOL 10 MG/ML IV BOLUS
INTRAVENOUS | Status: AC
  Filled 2015-06-14: qty 40

## 2015-06-14 MED ORDER — PROPOFOL 10 MG/ML IV BOLUS
INTRAVENOUS | Status: DC | PRN
  Administered 2015-06-14 (×2): 100 mg via INTRAVENOUS
  Administered 2015-06-14: 50 mg via INTRAVENOUS
  Administered 2015-06-14: 100 mg via INTRAVENOUS
  Administered 2015-06-14: 50 mg via INTRAVENOUS
  Administered 2015-06-14: 100 mg via INTRAVENOUS

## 2015-06-14 MED ORDER — SODIUM CHLORIDE 0.9 % IV SOLN: INTRAVENOUS | Status: DC

## 2015-06-14 MED ORDER — LACTATED RINGERS IV SOLN
INTRAVENOUS | Status: DC | PRN
  Administered 2015-06-14: 09:00:00 via INTRAVENOUS

## 2015-06-14 MED ORDER — LIDOCAINE HCL (PF) 2 % IJ SOLN
INTRAMUSCULAR | Status: DC | PRN
  Administered 2015-06-14: 30 mg via INTRADERMAL

## 2015-06-14 MED ORDER — LIDOCAINE HCL (CARDIAC) 20 MG/ML IV SOLN
INTRAVENOUS | Status: AC
  Filled 2015-06-14: qty 5

## 2015-06-14 SURGICAL SUPPLY — 25 items

## 2015-06-14 NOTE — H&P (Signed)
  Problem: Short segment Barrett's esophagus. Sister diagnosed with colon polyps under age 53. 05/02/2010 normal screening colonoscopy was performed. 05/23/2011 surveillance esophagogastroduodenoscopy with Barrett's mucosal biopsies showed no mucosal dysplasia.  History: The patient is a 55 year old male born 1960-06-29. He is scheduled to undergo a repeat screening colonoscopy and repeat surveillance esophagogastroduodenoscopy with Barrett's mucosal biopsies.  Past medical history: Hypercholesterolemia. Chronic gastroesophageal reflux complicated by short segment Barrett's esophagus. Depression. Gallstones. Mitral valve prolapse syndrome. History of supraventricular tachycardia. Cluster headaches. Appendectomy. Tonsillectomy. Right shoulder surgery.  Exam: The patient is alert and lying comfortably on the endoscopy stretcher. Abdomen is soft and nontender to palpation. Lungs are clear to auscultation. Cardiac exam reveals a regular rhythm.  Plan: Proceed with screening colonoscopy and surveillance esophagogastroduodenoscopy with Barrett's mucosal biopsies.

## 2015-06-14 NOTE — Transfer of Care (Signed)
Immediate Anesthesia Transfer of Care Note  Patient: Christian Nunez  Procedure(s) Performed: Procedure(s): COLONOSCOPY WITH PROPOFOL (N/A) ESOPHAGOGASTRODUODENOSCOPY (EGD) WITH PROPOFOL (N/A)  Patient Location: PACU  Anesthesia Type:MAC  Level of Consciousness:  sedated, patient cooperative and responds to stimulation  Airway & Oxygen Therapy:Patient Spontanous Breathing   Post-op Assessment:  Report given to PACU RN and Post -op Vital signs reviewed and stable  Post vital signs:  Reviewed and stable  Last Vitals:  Filed Vitals:   06/14/15 0835  BP: 167/103  Pulse: 54  Temp: 36.3 C  Resp: 18    Complications: No apparent anesthesia complications

## 2015-06-14 NOTE — Op Note (Signed)
[facility] Patient Name: Christian Nunez Procedure Date: 06/14/2015 MRN: [id number] Attending MD: Garlan Fair , MD Date of Birth: 10-18-1960 CSN: [id number] Age: 55 Admit Type: Outpatient Procedure:                Upper GI endoscopy Indications:              Surveillance for malignancy due to personal history                            of Barrett's esophagus Providers:                Garlan Fair, MD, Alinda Deem, RN, Alfonso Patten, Technician, Lajuana Carry, CRNA Referring MD:              Medicines:                Propofol per Anesthesia Complications:            No immediate complications. Estimated Blood Loss:     Estimated blood loss: none. Procedure:                Pre-Anesthesia Assessment:                           - Prior to the procedure, a History and Physical                            was performed, and patient medications and                            allergies were reviewed. The patient's tolerance of                            previous anesthesia was also reviewed. The risks                            and benefits of the procedure and the sedation                            options and risks were discussed with the patient.                            All questions were answered, and informed consent                            was obtained. Prior Anticoagulants: The patient has                            taken no previous anticoagulant or antiplatelet                            agents. ASA Grade Assessment: II - A patient with  mild systemic disease. After reviewing the risks                            and benefits, the patient was deemed in                            satisfactory condition to undergo the procedure.                           After obtaining informed consent, the endoscope was                            passed under direct vision. Throughout the   procedure, the patient's blood pressure, pulse, and                            oxygen saturations were monitored continuously. The                            [id number]) scope was introduced through the                            mouth, and advanced to the second part of duodenum.                            The upper GI endoscopy was accomplished without                            difficulty. The patient tolerated the procedure                            well. Scope In: Scope Out: Findings:      The Z-line was irregular consistent with short segment Barrett esophagus       and was found 42 cm from the incisors. Biopsies were taken with a cold       forceps for histology.      The examined esophagus was otherwise normal.      Four 3 to 6 mm sessile benign fundic gland appearing polyps with no       bleeding and no stigmata of recent bleeding were found in the stomach.       Polypectomy was attempted, initially using a cold snare and cold biopsy       forceps. Resection and retrieval were complete. One polyp was not       retrieved for exam.      The examined duodenum was normal. Impression:               - Z-line irregular, 42 cm from the incisors.                            Biopsied.                           - Normal esophagus.                           -  Four gastric polyps. Resected and retrieved.                           - Normal examined duodenum. Moderate Sedation:      N/A- Per Anesthesia Care Recommendation:           - Patient has a contact number available for                            emergencies. The signs and symptoms of potential                            delayed complications were discussed with the                            patient. Return to normal activities tomorrow.                            Written discharge instructions were provided to the                            patient.                           - Await pathology results.                            - Resume previous diet.                           - Continue present medications. Procedure Code(s):        --- Professional ---                           [phone], Esophagogastroduodenoscopy, flexible,                            transoral; with removal of tumor(s), polyp(s), or                            other lesion(s) by snare technique Diagnosis Code(s):        --- Professional ---                           K22.8, Other specified diseases of esophagus                           K22.70, Barrett's esophagus without dysplasia                           K31.7, Polyp of stomach and duodenum CPT copyright 2016 American Medical Association. All rights reserved. The codes documented in this report are preliminary and upon coder review may  be revised to meet current compliance requirements. Earle Gell, MD Garlan Fair, MD 06/14/2015 10:09:34 AM This report has been signed electronically. Number of Addenda: 0

## 2015-06-14 NOTE — Anesthesia Postprocedure Evaluation (Signed)
Anesthesia Post Note  Patient: Christian Nunez  Procedure(s) Performed: Procedure(s) (LRB): COLONOSCOPY WITH PROPOFOL (N/A) ESOPHAGOGASTRODUODENOSCOPY (EGD) WITH PROPOFOL (N/A)  Patient location during evaluation: PACU Anesthesia Type: MAC Level of consciousness: awake and alert Pain management: pain level controlled Vital Signs Assessment: post-procedure vital signs reviewed and stable Respiratory status: spontaneous breathing, nonlabored ventilation, respiratory function stable and patient connected to nasal cannula oxygen Cardiovascular status: stable and blood pressure returned to baseline Anesthetic complications: no    Last Vitals:  Filed Vitals:   06/14/15 1012 06/14/15 1022  BP: 156/91 160/92  Pulse: 51 50  Temp:    Resp: 14 17    Last Pain: There were no vitals filed for this visit.               [place]

## 2015-06-14 NOTE — Anesthesia Preprocedure Evaluation (Signed)
Anesthesia Evaluation  Patient identified by MRN, date of birth, ID band Patient awake    Reviewed: Allergy & Precautions, NPO status , Patient's Chart, lab work & pertinent test results  Airway Mallampati: I  TM Distance: >3 FB Neck ROM: Full    Dental   Pulmonary    Pulmonary exam normal        Cardiovascular hypertension, Pt. on medications Normal cardiovascular exam     Neuro/Psych    GI/Hepatic GERD  Controlled and Medicated,  Endo/Other    Renal/GU      Musculoskeletal   Abdominal   Peds  Hematology   Anesthesia Other Findings   Reproductive/Obstetrics                             Anesthesia Physical Anesthesia Plan  ASA: II  Anesthesia Plan: MAC   Post-op Pain Management:    Induction: Intravenous  Airway Management Planned: Natural Airway  Additional Equipment:   Intra-op Plan:   Post-operative Plan:   Informed Consent: I have reviewed the patients History and Physical, chart, labs and discussed the procedure including the risks, benefits and alternatives for the proposed anesthesia with the patient or authorized representative who has indicated his/her understanding and acceptance.     Plan Discussed with: CRNA and Surgeon  Anesthesia Plan Comments:         Anesthesia Quick Evaluation

## 2015-06-14 NOTE — Op Note (Signed)
[facility] Patient Name: Christian Nunez Procedure Date: 06/14/2015 MRN: [id number] Attending MD: Garlan Fair , MD Date of Birth: Jul 31, 1960 CSN: [id number] Age: 55 Admit Type: Outpatient Procedure:                Colonoscopy Indications:              Screening for colorectal malignant neoplasm. Sister                            diagnosed with colon polyps under age 10. Providers:                Garlan Fair, MD, Malka So, RN, Alfonso Patten, Technician, Lajuana Carry, CRNA Referring MD:              Medicines:                Propofol per Anesthesia Complications:            No immediate complications. Estimated Blood Loss:     Estimated blood loss: none. Procedure:                Pre-Anesthesia Assessment:                           - Prior to the procedure, a History and Physical                            was performed, and patient medications and                            allergies were reviewed. The patient's tolerance of                            previous anesthesia was also reviewed. The risks                            and benefits of the procedure and the sedation                            options and risks were discussed with the patient.                            All questions were answered, and informed consent                            was obtained. Prior Anticoagulants: The patient has                            taken no previous anticoagulant or antiplatelet                            agents. ASA Grade Assessment: II - A patient with  mild systemic disease. After reviewing the risks                            and benefits, the patient was deemed in                            satisfactory condition to undergo the procedure.                           After obtaining informed consent, the colonoscope                            was passed under direct vision. Throughout the            procedure, the patient's blood pressure, pulse, and                            oxygen saturations were monitored continuously. The                            [id number]) scope was introduced through                            the anus and advanced to the the cecum, identified                            by appendiceal orifice and ileocecal valve. The                            colonoscopy was performed without difficulty. The                            patient tolerated the procedure well. The quality                            of the bowel preparation was good. The terminal                            ileum, the ileocecal valve, the appendiceal orifice                            and the rectum were photographed. Scope In: 9:33:42 AM Scope Out: 9:48:46 AM Scope Withdrawal Time: 0 hours 9 minutes 54 seconds  Total Procedure Duration: 0 hours 15 minutes 4 seconds  Findings:      The perianal and digital rectal examinations were normal.      The entire examined colon appeared normal. Impression:               - The entire examined colon is normal.                           - No specimens collected. Moderate Sedation:      N/A- Per Anesthesia Care Recommendation:           - Repeat colonoscopy in 5 years for screening  purposes.                           - Patient has a contact number available for                            emergencies. The signs and symptoms of potential                            delayed complications were discussed with the                            patient. Return to normal activities tomorrow.                            Written discharge instructions were provided to the                            patient.                           - Resume previous diet.                           - Continue present medications. Procedure Code(s):        --- Professional ---                           [phone], Colonoscopy, flexible; diagnostic,  including                            collection of specimen(s) by brushing or washing,                            when performed (separate procedure) Diagnosis Code(s):        --- Professional ---                           Z12.11, Encounter for screening for malignant                            neoplasm of colon CPT copyright 2016 American Medical Association. All rights reserved. The codes documented in this report are preliminary and upon coder review may  be revised to meet current compliance requirements. Earle Gell, MD Garlan Fair, MD 06/14/2015 10:13:39 AM This report has been signed electronically. Number of Addenda: 0

## 2015-06-14 NOTE — Discharge Instructions (Signed)
Colonoscopy, Care After °These instructions give you information on caring for yourself after your procedure. Your doctor may also give you more specific instructions. Call your doctor if you have any problems or questions after your procedure. °HOME CARE °· Do not drive for 24 hours. °· Do not sign important papers or use machinery for 24 hours. °· You may shower. °· You may go back to your usual activities, but go slower for the first 24 hours. °· Take rest breaks often during the first 24 hours. °· Walk around or use warm packs on your belly (abdomen) if you have belly cramping or gas. °· Drink enough fluids to keep your pee (urine) clear or pale yellow. °· Resume your normal diet. Avoid heavy or fried foods. °· Avoid drinking alcohol for 24 hours or as told by your doctor. °· Only take medicines as told by your doctor. °If a tissue sample (biopsy) was taken during the procedure:  °· Do not take aspirin or blood thinners for 7 days, or as told by your doctor. °· Do not drink alcohol for 7 days, or as told by your doctor. °· Eat soft foods for the first 24 hours. °GET HELP IF: °You still have a small amount of blood in your poop (stool) 2-3 days after the procedure. °GET HELP RIGHT AWAY IF: °· You have more than a small amount of blood in your poop. °· You see clumps of tissue (blood clots) in your poop. °· Your belly is puffy (swollen). °· You feel sick to your stomach (nauseous) or throw up (vomit). °· You have a fever. °· You have belly pain that gets worse and medicine does not help. °MAKE SURE YOU: °· Understand these instructions. °· Will watch your condition. °· Will get help right away if you are not doing well or get worse. °  °This information is not intended to replace advice given to you by your health care provider. Make sure you discuss any questions you have with your health care provider. °  °Document Released: 02/25/2010 Document Revised: 01/28/2013 Document Reviewed: 09/30/2012 °Elsevier  Interactive Patient Education ©2016 Elsevier Inc. °Esophagogastroduodenoscopy, Care After °Refer to this sheet in the next few weeks. These instructions provide you with information about caring for yourself after your procedure. Your health care provider may also give you more specific instructions. Your treatment has been planned according to current medical practices, but problems sometimes occur. Call your health care provider if you have any problems or questions after your procedure. °WHAT TO EXPECT AFTER THE PROCEDURE °After your procedure, it is typical to feel: °· Soreness in your throat. °· Pain with swallowing. °· Sick to your stomach (nauseous). °· Bloated. °· Dizzy. °· Fatigued. °HOME CARE INSTRUCTIONS °· Do not eat or drink anything until the numbing medicine (local anesthetic) has worn off and your gag reflex has returned. You will know that the local anesthetic has worn off when you can swallow comfortably. °· Do not drive or operate machinery until directed by your health care provider. °· Take medicines only as directed by your health care provider. °SEEK MEDICAL CARE IF:  °· You cannot stop coughing. °· You are not urinating at all or less than usual. °SEEK IMMEDIATE MEDICAL CARE IF: °· You have difficulty swallowing. °· You cannot eat or drink. °· You have worsening throat or chest pain. °· You have dizziness or lightheadedness or you faint. °· You have nausea or vomiting. °· You have chills. °· You have a fever. °·   You have severe abdominal pain. °· You have black, tarry, or bloody stools. °  °This information is not intended to replace advice given to you by your health care provider. Make sure you discuss any questions you have with your health care provider. °  °Document Released: 01/10/2012 Document Revised: 02/13/2014 Document Reviewed: 01/10/2012 °Elsevier Interactive Patient Education ©2016 Elsevier Inc. ° °

## 2015-06-15 ENCOUNTER — Encounter (HOSPITAL_COMMUNITY): Payer: Self-pay | Admitting: Gastroenterology

## 2016-04-19 DIAGNOSIS — R7989 Other specified abnormal findings of blood chemistry: Secondary | ICD-10-CM | POA: Diagnosis not present

## 2016-04-19 DIAGNOSIS — K219 Gastro-esophageal reflux disease without esophagitis: Secondary | ICD-10-CM | POA: Diagnosis not present

## 2016-04-19 DIAGNOSIS — Z23 Encounter for immunization: Secondary | ICD-10-CM | POA: Diagnosis not present

## 2016-04-19 DIAGNOSIS — Z125 Encounter for screening for malignant neoplasm of prostate: Secondary | ICD-10-CM | POA: Diagnosis not present

## 2016-04-19 DIAGNOSIS — Z0000 Encounter for general adult medical examination without abnormal findings: Secondary | ICD-10-CM | POA: Diagnosis not present

## 2016-04-19 DIAGNOSIS — I10 Essential (primary) hypertension: Secondary | ICD-10-CM | POA: Diagnosis not present

## 2016-06-13 DIAGNOSIS — I10 Essential (primary) hypertension: Secondary | ICD-10-CM | POA: Diagnosis not present

## 2017-02-06 DIAGNOSIS — J069 Acute upper respiratory infection, unspecified: Secondary | ICD-10-CM | POA: Diagnosis not present

## 2017-02-06 DIAGNOSIS — R509 Fever, unspecified: Secondary | ICD-10-CM | POA: Diagnosis not present

## 2017-02-06 DIAGNOSIS — M791 Myalgia, unspecified site: Secondary | ICD-10-CM | POA: Diagnosis not present

## 2017-05-04 DIAGNOSIS — E559 Vitamin D deficiency, unspecified: Secondary | ICD-10-CM | POA: Diagnosis not present

## 2017-05-04 DIAGNOSIS — G4700 Insomnia, unspecified: Secondary | ICD-10-CM | POA: Diagnosis not present

## 2017-05-04 DIAGNOSIS — R413 Other amnesia: Secondary | ICD-10-CM | POA: Diagnosis not present

## 2017-05-11 DIAGNOSIS — I10 Essential (primary) hypertension: Secondary | ICD-10-CM | POA: Diagnosis not present

## 2017-05-11 DIAGNOSIS — K219 Gastro-esophageal reflux disease without esophagitis: Secondary | ICD-10-CM | POA: Diagnosis not present

## 2017-05-11 DIAGNOSIS — Z0000 Encounter for general adult medical examination without abnormal findings: Secondary | ICD-10-CM | POA: Diagnosis not present

## 2017-06-26 DIAGNOSIS — E291 Testicular hypofunction: Secondary | ICD-10-CM | POA: Diagnosis not present

## 2017-06-26 DIAGNOSIS — I10 Essential (primary) hypertension: Secondary | ICD-10-CM | POA: Diagnosis not present

## 2017-06-26 DIAGNOSIS — Z79899 Other long term (current) drug therapy: Secondary | ICD-10-CM | POA: Diagnosis not present

## 2017-06-26 DIAGNOSIS — R609 Edema, unspecified: Secondary | ICD-10-CM | POA: Diagnosis not present

## 2017-11-29 DIAGNOSIS — Z79899 Other long term (current) drug therapy: Secondary | ICD-10-CM | POA: Diagnosis not present

## 2017-11-29 DIAGNOSIS — E291 Testicular hypofunction: Secondary | ICD-10-CM | POA: Diagnosis not present

## 2017-11-29 DIAGNOSIS — I10 Essential (primary) hypertension: Secondary | ICD-10-CM | POA: Diagnosis not present

## 2017-11-29 DIAGNOSIS — Z125 Encounter for screening for malignant neoplasm of prostate: Secondary | ICD-10-CM | POA: Diagnosis not present

## 2019-07-10 ENCOUNTER — Encounter: Payer: Self-pay | Admitting: Orthopedic Surgery

## 2019-07-10 ENCOUNTER — Ambulatory Visit: Payer: Self-pay

## 2019-07-10 ENCOUNTER — Other Ambulatory Visit: Payer: Self-pay

## 2019-07-10 ENCOUNTER — Ambulatory Visit (INDEPENDENT_AMBULATORY_CARE_PROVIDER_SITE_OTHER): Payer: 59 | Admitting: Orthopedic Surgery

## 2019-07-10 VITALS — Ht 75.0 in | Wt 220.0 lb

## 2019-07-10 DIAGNOSIS — M25561 Pain in right knee: Secondary | ICD-10-CM

## 2019-07-14 ENCOUNTER — Encounter: Payer: Self-pay | Admitting: Orthopedic Surgery

## 2019-07-14 DIAGNOSIS — M25561 Pain in right knee: Secondary | ICD-10-CM

## 2019-07-14 MED ORDER — METHYLPREDNISOLONE ACETATE 40 MG/ML IJ SUSP
40.0000 mg | INTRAMUSCULAR | Status: AC | PRN
  Administered 2019-07-14: 40 mg via INTRA_ARTICULAR

## 2019-07-14 MED ORDER — LIDOCAINE HCL (PF) 1 % IJ SOLN
5.0000 mL | INTRAMUSCULAR | Status: AC | PRN
  Administered 2019-07-14: 5 mL

## 2019-07-14 NOTE — Progress Notes (Signed)
Office Visit Note   Patient: Christian Nunez           Date of Birth: 15-Feb-1960           MRN: [id number] Visit Date: 07/10/2019              Requested by: Lavone Orn, MD [address] [place],  [place] [address] PCP: Lavone Orn, MD  Chief Complaint  Patient presents with  . Right Knee - Pain, New Patient (Initial Visit)      HPI: Patient is a 59 year old gentleman who presents complaining of medial joint line pain right knee.  Patient states the knee pain starts as a tightness and loosens up with prolonged activities.  Patient states the pain is variable.   Assessment & Plan: Visit Diagnoses:  1. Right knee pain, unspecified chronicity     Plan: Patient underwent an injection and he tolerated this well we will obtain an MRI scan if he does not have significant improvement with the injection   Follow-Up Instructions: Return if symptoms worsen or fail to improve.   Ortho Exam  Patient is alert, oriented, no adenopathy, well-dressed, normal affect, normal respiratory effort. Examination patient has a mild effusion of the right knee the patellofemoral joint is nontender to palpation there is tenderness to palpation of the medial joint line collaterals and cruciates are stable.  There is mild crepitation with range of motion.  Imaging: No results found. No images are attached to the encounter.  Labs: No results found for: HGBA1C, ESRSEDRATE, CRP, LABURIC, REPTSTATUS, GRAMSTAIN, CULT, LABORGA   No results found for: ALBUMIN, PREALBUMIN, LABURIC  No results found for: MG No results found for: VD25OH  No results found for: PREALBUMIN CBC EXTENDED Latest Ref Rng & Units 01/07/2013  HGB 13.0 - 17.0 g/dL 15.4     Body mass index is 27.5 kg/m.  Orders:  Orders Placed This Encounter  Procedures  . XR Knee 1-2 Views Right   No orders of the defined types were placed in this encounter.    Procedures: Large Joint Inj: R knee on 07/14/2019 1:12  PM Indications: pain and diagnostic evaluation Details: 22 G 1.5 in needle, anteromedial approach  Arthrogram: No  Medications: 5 mL lidocaine (PF) 1 %; 40 mg methylPREDNISolone acetate 40 MG/ML Outcome: tolerated well, no immediate complications Procedure, treatment alternatives, risks and benefits explained, specific risks discussed. Consent was given by the patient. Immediately prior to procedure a time out was called to verify the correct patient, procedure, equipment, support staff and site/side marked as required. Patient was prepped and draped in the usual sterile fashion.      Clinical Data: No additional findings.  ROS:  All other systems negative, except as noted in the HPI. Review of Systems  Objective: Vital Signs: Ht 6\' 3"  (1.905 m)   Wt 220 lb (99.8 kg)   BMI 27.50 kg/m   Specialty Comments:  No specialty comments available.  PMFS History: Patient Active Problem List   Diagnosis Date Noted  . Heart murmur   . GERD (gastroesophageal reflux disease)   . SLAP (superior labrum from anterior to posterior) tear   . Lipoma of neck 09/18/2012   Past Medical History:  Diagnosis Date  . GERD (gastroesophageal reflux disease)   . Heart murmur    echo in college-do not hear now  . Hypertension   . SLAP (superior labrum from anterior to posterior) tear   . Wears glasses    reading  No family history on file.  Past Surgical History:  Procedure Laterality Date  . APPENDECTOMY    . COLONOSCOPY WITH PROPOFOL N/A 06/14/2015   Procedure: COLONOSCOPY WITH PROPOFOL;  Surgeon: Garlan Fair, MD;  Location: [facility];  Service: Endoscopy;  Laterality: N/A;  . ESOPHAGOGASTRODUODENOSCOPY (EGD) WITH PROPOFOL N/A 06/14/2015   Procedure: ESOPHAGOGASTRODUODENOSCOPY (EGD) WITH PROPOFOL;  Surgeon: Garlan Fair, MD;  Location: [facility];  Service: Endoscopy;  Laterality: N/A;  . LIPOMA EXCISION    . SHOULDER ARTHROSCOPY WITH LABRAL REPAIR Right 01/07/2013    Procedure: SHOULDER ARTHROSCOPY WITH LABRAL REPAIR AND PARTIAL ROTATOR CUFF DEBRIDMENT;  Surgeon: Lorn Junes, MD;  Location: [place];  Service: Orthopedics;  Laterality: Right;  . TONSILLECTOMY     Social History   Occupational History  . Not on file  Tobacco Use  . Smoking status: Never Smoker  . Smokeless tobacco: Never Used  Substance and Sexual Activity  . Alcohol use: Yes    Comment: occ  . Drug use: No  . Sexual activity: Yes

## 2020-06-16 DIAGNOSIS — M25561 Pain in right knee: Secondary | ICD-10-CM | POA: Diagnosis not present

## 2020-07-27 DIAGNOSIS — L818 Other specified disorders of pigmentation: Secondary | ICD-10-CM | POA: Diagnosis not present

## 2020-07-27 DIAGNOSIS — Z1283 Encounter for screening for malignant neoplasm of skin: Secondary | ICD-10-CM | POA: Diagnosis not present

## 2020-07-27 DIAGNOSIS — D225 Melanocytic nevi of trunk: Secondary | ICD-10-CM | POA: Diagnosis not present

## 2020-09-08 DIAGNOSIS — Z125 Encounter for screening for malignant neoplasm of prostate: Secondary | ICD-10-CM | POA: Diagnosis not present

## 2020-09-08 DIAGNOSIS — E291 Testicular hypofunction: Secondary | ICD-10-CM | POA: Diagnosis not present

## 2020-09-08 DIAGNOSIS — K219 Gastro-esophageal reflux disease without esophagitis: Secondary | ICD-10-CM | POA: Diagnosis not present

## 2020-09-08 DIAGNOSIS — E7800 Pure hypercholesterolemia, unspecified: Secondary | ICD-10-CM | POA: Diagnosis not present

## 2020-09-08 DIAGNOSIS — Z0000 Encounter for general adult medical examination without abnormal findings: Secondary | ICD-10-CM | POA: Diagnosis not present

## 2020-09-08 DIAGNOSIS — I10 Essential (primary) hypertension: Secondary | ICD-10-CM | POA: Diagnosis not present

## 2020-09-13 ENCOUNTER — Other Ambulatory Visit: Payer: Self-pay | Admitting: Internal Medicine

## 2020-09-13 DIAGNOSIS — E7800 Pure hypercholesterolemia, unspecified: Secondary | ICD-10-CM

## 2020-09-30 DIAGNOSIS — X58XXXA Exposure to other specified factors, initial encounter: Secondary | ICD-10-CM | POA: Diagnosis not present

## 2020-09-30 DIAGNOSIS — M94261 Chondromalacia, right knee: Secondary | ICD-10-CM | POA: Diagnosis not present

## 2020-09-30 DIAGNOSIS — Y999 Unspecified external cause status: Secondary | ICD-10-CM | POA: Diagnosis not present

## 2020-09-30 DIAGNOSIS — S83231A Complex tear of medial meniscus, current injury, right knee, initial encounter: Secondary | ICD-10-CM | POA: Diagnosis not present

## 2020-09-30 DIAGNOSIS — G8918 Other acute postprocedural pain: Secondary | ICD-10-CM | POA: Diagnosis not present

## 2020-10-07 DIAGNOSIS — M25561 Pain in right knee: Secondary | ICD-10-CM | POA: Diagnosis not present

## 2020-10-07 DIAGNOSIS — M25661 Stiffness of right knee, not elsewhere classified: Secondary | ICD-10-CM | POA: Diagnosis not present

## 2020-10-13 DIAGNOSIS — M25561 Pain in right knee: Secondary | ICD-10-CM | POA: Diagnosis not present

## 2020-10-13 DIAGNOSIS — M25661 Stiffness of right knee, not elsewhere classified: Secondary | ICD-10-CM | POA: Diagnosis not present

## 2020-10-15 ENCOUNTER — Ambulatory Visit
Admission: RE | Admit: 2020-10-15 | Discharge: 2020-10-15 | Disposition: A | Discharge status: Home / Self Care | Payer: 59 | Source: Ambulatory Visit | Attending: Internal Medicine | Admitting: Internal Medicine

## 2020-10-15 DIAGNOSIS — E7800 Pure hypercholesterolemia, unspecified: Secondary | ICD-10-CM

## 2020-12-15 ENCOUNTER — Ambulatory Visit (HOSPITAL_BASED_OUTPATIENT_CLINIC_OR_DEPARTMENT_OTHER): Payer: BC Managed Care – PPO | Admitting: Family Medicine

## 2020-12-15 ENCOUNTER — Other Ambulatory Visit: Payer: Self-pay

## 2020-12-15 ENCOUNTER — Encounter (HOSPITAL_BASED_OUTPATIENT_CLINIC_OR_DEPARTMENT_OTHER): Payer: Self-pay | Admitting: Family Medicine

## 2020-12-15 VITALS — BP 132/92 | HR 62 | Ht 75.0 in | Wt 224.0 lb

## 2020-12-15 DIAGNOSIS — Z0000 Encounter for general adult medical examination without abnormal findings: Secondary | ICD-10-CM | POA: Diagnosis not present

## 2020-12-15 DIAGNOSIS — R7989 Other specified abnormal findings of blood chemistry: Secondary | ICD-10-CM | POA: Diagnosis not present

## 2020-12-15 DIAGNOSIS — Z7689 Persons encountering health services in other specified circumstances: Secondary | ICD-10-CM | POA: Diagnosis not present

## 2020-12-15 NOTE — Progress Notes (Signed)
New Patient Office Visit  Subjective:  Patient ID: Christian Nunez, male    DOB: [birth year]  Age: 60 y.o. MRN: [id number]  CC:  Chief Complaint  Patient presents with   Establish Care    Prior PCP- Dr. Laurann Montana with [facility]    Patient has no concerns or complaints. He is requesting to have lab work, more specifically to have his PSA and testosterone checked    HPI Christian Nunez is a 60 year old male presenting to establish in clinic.  He has current concerns as outlined above.  Reports past medical history of GERD.  GERD: Currently taking omeprazole with good relief.  Was following with GI at [facility], however his current provider is retiring and he is requesting referral to new provider, is wanting to stay with [facility].  Patient is originally from [place], [place].  He attended [employer], is now working as a Programme researcher, broadcasting/film/video.  Hobbies include exercise, used to run fairly often, however due to musculoskeletal issues he does not engage in this anymore.  He does do a fair amount of hiking. He has questions today regarding testing for PSA and testosterone levels.  Would also like baseline labs completed.  Reports that he was taking androgenic supplement which she feels has helped maintain his testosterone in a normal range.  Past Medical History:  Diagnosis Date   GERD (gastroesophageal reflux disease)    Heart murmur    echo in college-do not hear now   Hypertension    SLAP (superior labrum from anterior to posterior) tear    Wears glasses    reading    Past Surgical History:  Procedure Laterality Date   APPENDECTOMY     COLONOSCOPY WITH PROPOFOL N/A 06/14/2015   Procedure: COLONOSCOPY WITH PROPOFOL;  Surgeon: Garlan Fair, MD;  Location: [facility];  Service: Endoscopy;  Laterality: N/A;   ESOPHAGOGASTRODUODENOSCOPY (EGD) WITH PROPOFOL N/A 06/14/2015   Procedure: ESOPHAGOGASTRODUODENOSCOPY (EGD) WITH PROPOFOL;  Surgeon: Garlan Fair, MD;   Location: [facility];  Service: Endoscopy;  Laterality: N/A;   LIPOMA EXCISION     SHOULDER ARTHROSCOPY WITH LABRAL REPAIR Right 01/07/2013   Procedure: SHOULDER ARTHROSCOPY WITH LABRAL REPAIR AND PARTIAL ROTATOR CUFF DEBRIDMENT;  Surgeon: Lorn Junes, MD;  Location: [place];  Service: Orthopedics;  Laterality: Right;   TONSILLECTOMY      History reviewed. No pertinent family history.  Social History   Socioeconomic History   Marital status: Married    Spouse name: Not on file   Number of children: Not on file   Years of education: Not on file   Highest education level: Not on file  Occupational History   Not on file  Tobacco Use   Smoking status: Never   Smokeless tobacco: Never  Vaping Use   Vaping Use: Never used  Substance and Sexual Activity   Alcohol use: Yes    Comment: occ   Drug use: No   Sexual activity: Yes    Birth control/protection: None  Other Topics Concern   Not on file  Social History Narrative   ** Merged History Encounter **       Social Determinants of Health   Financial Resource Strain: Not on file  Food Insecurity: Not on file  Transportation Needs: Not on file  Physical Activity: Not on file  Stress: Not on file  Social Connections: Not on file  Intimate Partner Violence: Not on file  Objective:   Today's Vitals: BP (!) 132/92   Pulse 62   Ht 6\' 3"  (1.905 m)   Wt 224 lb (101.6 kg)   SpO2 98%   BMI 28.00 kg/m   Physical Exam  60 year old male in no acute distress Cardiovascular exam with regular rate and rhythm, no murmurs appreciated Lungs clear to auscultation bilaterally  Assessment & Plan:   Problem List Items Addressed This Visit       Other   Low testosterone   Relevant Orders   Testosterone   PSA Total (Reflex To Free)   Other Visit Diagnoses     Wellness examination    -  Primary   Relevant Orders   CBC with Differential/Platelet   Comprehensive metabolic panel   Lipid panel   TSH  Rfx on Abnormal to Free T4   Hemoglobin A1c   Encounter to establish care       Relevant Orders   Ambulatory referral to Gastroenterology       Outpatient Encounter Medications as of 12/15/2020  Medication Sig   omeprazole (PRILOSEC) 20 MG capsule Take 20 mg by mouth daily.   No facility-administered encounter medications on file as of 12/15/2020.    Follow-up: Return in about 6 months (around 06/14/2021) for Follow Up.  Plan for follow-up in about 6 months for CPE or sooner as needed  Brizeida Mcmurry J [place], MD

## 2020-12-15 NOTE — Assessment & Plan Note (Signed)
We will check testosterone level today to assess current status

## 2020-12-15 NOTE — Patient Instructions (Signed)
  Medication Instructions:  Your physician recommends that you continue on your current medications as directed. Please refer to the Current Medication list given to you today. --If you need a refill on any your medications before your next appointment, please call your pharmacy first. If no refills are authorized on file call the office.-- Lab Work: Your physician has recommended that you have lab work today: CBC, CMP, Lipid, A1C, PSA, and Testosterone, TSH If you have labs (blood work) drawn today and your tests are completely normal, you will receive your results via [place] a phone call from our staff.  Please ensure you check your voicemail in the event that you authorized detailed messages to be left on a delegated number. If you have any lab test that is abnormal or we need to change your treatment, we will call you to review the results.  Referrals/Procedures/Imaging: A referral has been placed for you to [facility] for evaluation and treatment. Someone from the scheduling department will be in contact with you in regards to coordinating your consultation. If you do not hear from any of the schedulers within 7-10 business days please give their office a call.  Follow-Up: Your next appointment:   Your physician recommends that you schedule a follow-up appointment in: 73 MONTHS for CPE with Dr. de [place]  You will receive a text message or e-mail with a link to a survey about your care and experience with [place] today! We would greatly appreciate your feedback!   Thanks for letting us be apart of your health journey!!  Primary Care and Sports Medicine   Dr. Arlina Robes [place]   We encourage you to activate your patient portal called "MyChart".  Sign up information is provided on this After Visit Summary.  MyChart is used to connect with patients for Virtual Visits (Telemedicine).  Patients are able to view lab/test results, encounter notes, upcoming appointments, etc.   Non-urgent messages can be sent to your provider as well. To learn more about what you can do with MyChart, please visit --  [email or web address].

## 2020-12-16 LAB — CBC WITH DIFFERENTIAL/PLATELET
Basophils Absolute: 0 10*3/uL (ref 0.0–0.2)
Basos: 1 %
EOS (ABSOLUTE): 0.3 10*3/uL (ref 0.0–0.4)
Eos: 5 %
Hematocrit: 45.9 % (ref 37.5–51.0)
Hemoglobin: 16.1 g/dL (ref 13.0–17.7)
Immature Grans (Abs): 0 10*3/uL (ref 0.0–0.1)
Immature Granulocytes: 0 %
Lymphocytes Absolute: 1.8 10*3/uL (ref 0.7–3.1)
Lymphs: 32 %
MCH: 33 pg (ref 26.6–33.0)
MCHC: 35.1 g/dL (ref 31.5–35.7)
MCV: 94 fL (ref 79–97)
Monocytes Absolute: 0.5 10*3/uL (ref 0.1–0.9)
Monocytes: 9 %
Neutrophils Absolute: 2.9 10*3/uL (ref 1.4–7.0)
Neutrophils: 53 %
Platelets: 205 10*3/uL (ref 150–450)
RBC: 4.88 x10E6/uL (ref 4.14–5.80)
RDW: 12.4 % (ref 11.6–15.4)
WBC: 5.5 10*3/uL (ref 3.4–10.8)

## 2020-12-16 LAB — PSA TOTAL (REFLEX TO FREE): Prostate Specific Ag, Serum: 0.8 ng/mL (ref 0.0–4.0)

## 2020-12-16 LAB — COMPREHENSIVE METABOLIC PANEL
ALT: 32 IU/L (ref 0–44)
AST: 25 IU/L (ref 0–40)
Albumin/Globulin Ratio: 2.1 (ref 1.2–2.2)
Albumin: 4.7 g/dL (ref 3.8–4.9)
Alkaline Phosphatase: 46 IU/L (ref 44–121)
BUN/Creatinine Ratio: 13 (ref 9–20)
BUN: 12 mg/dL (ref 6–24)
Bilirubin Total: 0.7 mg/dL (ref 0.0–1.2)
CO2: 26 mmol/L (ref 20–29)
Calcium: 9.4 mg/dL (ref 8.7–10.2)
Chloride: 103 mmol/L (ref 96–106)
Creatinine, Ser: 0.93 mg/dL (ref 0.76–1.27)
Globulin, Total: 2.2 g/dL (ref 1.5–4.5)
Glucose: 103 mg/dL — ABNORMAL HIGH (ref 70–99)
Potassium: 5.2 mmol/L (ref 3.5–5.2)
Sodium: 140 mmol/L (ref 134–144)
Total Protein: 6.9 g/dL (ref 6.0–8.5)
eGFR: 95 mL/min/{1.73_m2} (ref >59)

## 2020-12-16 LAB — TSH RFX ON ABNORMAL TO FREE T4: TSH: 1.54 u[IU]/mL (ref 0.450–4.500)

## 2020-12-16 LAB — TESTOSTERONE: Testosterone: 1113 ng/dL — ABNORMAL HIGH (ref 264–916)

## 2020-12-16 LAB — LIPID PANEL
Chol/HDL Ratio: 4.3 ratio (ref 0.0–5.0)
Cholesterol, Total: 222 mg/dL — ABNORMAL HIGH (ref 100–199)
HDL: 52 mg/dL (ref >39)
LDL Chol Calc (NIH): 155 mg/dL — ABNORMAL HIGH (ref 0–99)
Triglycerides: 87 mg/dL (ref 0–149)
VLDL Cholesterol Cal: 15 mg/dL (ref 5–40)

## 2020-12-16 LAB — HEMOGLOBIN A1C
Est. average glucose Bld gHb Est-mCnc: 100 mg/dL
Hgb A1c MFr Bld: 5.1 % (ref 4.8–5.6)

## 2020-12-20 DIAGNOSIS — M1711 Unilateral primary osteoarthritis, right knee: Secondary | ICD-10-CM | POA: Diagnosis not present

## 2020-12-27 DIAGNOSIS — M1711 Unilateral primary osteoarthritis, right knee: Secondary | ICD-10-CM | POA: Diagnosis not present

## 2021-01-03 DIAGNOSIS — M1711 Unilateral primary osteoarthritis, right knee: Secondary | ICD-10-CM | POA: Diagnosis not present

## 2021-01-05 ENCOUNTER — Encounter: Payer: Self-pay | Admitting: Gastroenterology

## 2021-02-11 ENCOUNTER — Other Ambulatory Visit: Payer: Self-pay

## 2021-02-11 DIAGNOSIS — R739 Hyperglycemia, unspecified: Secondary | ICD-10-CM | POA: Insufficient documentation

## 2021-02-11 DIAGNOSIS — Z83719 Family history of colon polyps, unspecified: Secondary | ICD-10-CM | POA: Insufficient documentation

## 2021-02-11 DIAGNOSIS — E7800 Pure hypercholesterolemia, unspecified: Secondary | ICD-10-CM | POA: Insufficient documentation

## 2021-02-11 DIAGNOSIS — M19019 Primary osteoarthritis, unspecified shoulder: Secondary | ICD-10-CM | POA: Insufficient documentation

## 2021-02-11 DIAGNOSIS — Z8371 Family history of colonic polyps: Secondary | ICD-10-CM | POA: Insufficient documentation

## 2021-02-11 DIAGNOSIS — G44009 Cluster headache syndrome, unspecified, not intractable: Secondary | ICD-10-CM | POA: Insufficient documentation

## 2021-02-11 DIAGNOSIS — I10 Essential (primary) hypertension: Secondary | ICD-10-CM | POA: Insufficient documentation

## 2021-02-11 DIAGNOSIS — E291 Testicular hypofunction: Secondary | ICD-10-CM | POA: Insufficient documentation

## 2021-02-11 DIAGNOSIS — I341 Nonrheumatic mitral (valve) prolapse: Secondary | ICD-10-CM | POA: Insufficient documentation

## 2021-02-11 DIAGNOSIS — N529 Male erectile dysfunction, unspecified: Secondary | ICD-10-CM | POA: Insufficient documentation

## 2021-02-11 DIAGNOSIS — K8020 Calculus of gallbladder without cholecystitis without obstruction: Secondary | ICD-10-CM | POA: Insufficient documentation

## 2021-02-16 ENCOUNTER — Ambulatory Visit: Payer: BC Managed Care – PPO | Admitting: Gastroenterology

## 2021-02-16 ENCOUNTER — Telehealth: Payer: Self-pay

## 2021-02-16 ENCOUNTER — Encounter: Payer: Self-pay | Admitting: Gastroenterology

## 2021-02-16 VITALS — BP 122/78 | HR 70 | Ht 75.0 in | Wt 217.0 lb

## 2021-02-16 DIAGNOSIS — Z8371 Family history of colonic polyps: Secondary | ICD-10-CM

## 2021-02-16 DIAGNOSIS — M1711 Unilateral primary osteoarthritis, right knee: Secondary | ICD-10-CM | POA: Diagnosis not present

## 2021-02-16 DIAGNOSIS — K219 Gastro-esophageal reflux disease without esophagitis: Secondary | ICD-10-CM

## 2021-02-16 DIAGNOSIS — K2270 Barrett's esophagus without dysplasia: Secondary | ICD-10-CM

## 2021-02-16 NOTE — Telephone Encounter (Signed)
ROI faxed to [facility] for last colonoscopy/EGD/ and any pathology.

## 2021-02-16 NOTE — Patient Instructions (Addendum)
It was a pleasure to meet you today. ° °I asked you to sign a release so that I can obtain records from [facility]. ° °I did not change your medicines.  Please continue to take omeprazole daily. ° °We will schedule an upper endoscopy with Barrett's biopsies at your convenience. At this time the schedule is only open through March 2. We will put in a recall reminder to contact you once dates later in March are open. ° °Modifying diet and lifestyle remains the foundation for treating the symptoms of reflux. The following strategies help you prevent heartburn and other symptoms by avoiding foods that reduce the effectiveness of the bottom of the esophagus from protecting the esophagus from from acid injury and keeping stomach contents where they belong. ° °Eat smaller meals. A large meal remains in the stomach for several hours, increasing the chances for gastroesophageal reflux. Try distributing your daily food intake over three, four, or five smaller meals. ° °Relax when you eat. Stress increases the production of stomach acid, so make meals a pleasant, relaxing experience. Sit down. Eat slowly. Chew completely. Play soothing music. ° °Relax between meals. Relaxation therapies such as deep breathing, meditation, massage, tai chi, or yoga may help prevent and relieve heartburn.  ° °Remain upright after eating. You should maintain postures that reduce the risk for reflux for at least three hours after eating. For example, don’t bend over or strain to lift heavy objects. ° °Avoid eating within three hours of going to bed. Lying down after eating will increase chances of reflux. ° °Lose weight. Excess pounds increase pressure on the stomach and can push acid into the esophagus. ° °Loosen up. Avoid tight belts, waistbands, and other clothing that puts pressure on your stomach. ° °Avoid foods that burn. Abstain from food or drink that increases gastric acid secretion, decreases the valve at the bottom of the esophagus, or  slows the emptying of the stomach. Known offenders include high-fat foods, spicy dishes, tomatoes and tomato products, citrus fruits, garlic, onions, milk, carbonated drinks, coffee (including decaf), tea, chocolate, mints, and alcohol. ° °Avoid medications that can predispose you to reflux including aspirin and other NSAIDs, oral contraceptives, hormone therapy drugs, and certain antidepressants. ° °Sleep at an angle. If you’re bothered by nighttime heartburn, place a wedge (available in medical supply stores or a wedge pillow through [employer]) under your upper body. But don’t elevate your head with extra pillows. That makes reflux worse by bending you at °the waist and compressing your stomach. You might also try sleeping on your left side, as studies have shown this can help--perhaps because the stomach is on the left side of the body, so lying on your left positions most of the stomach below the bottom of the esophagus.  ° °Please call or send me a MyChart message with any questions or concerns prior to your endoscopy. ° °

## 2021-02-16 NOTE — Progress Notes (Signed)
Referring Provider: de [place], Blondell Reveal, MD Primary Care Physician:  de [place], Blondell Reveal, MD  Reason for Consultation: Barrett's Esophagus   IMPRESSION:  Barrett's Esophagus without dysplasia in the setting of chronic reflux: Continue omeprazole. Plan EGD with biopsies.   Family history of colon polyps: Last colonoscopy 2017. Due for surveillance.   PLAN: - Obtain records from [facility] regarding prior endoscopic exams and pathology results - Continue omeprazole 20 mg daily - GERD lifestyle modifications - EGD with Barrett's biopsies - Surveillance colonoscopy for colon cancer screening   HPI: Christian Nunez is a 61 y.o. male referred by Dr. De [place] for further evaluation of reflux and Barrett's Esophagus.  History is obtained through the patient and review of his electronic health record.  He has a longstanding history of reflux well-controlled with omeprazole 20 mg QD.  Symptoms are predominant brash and heartburn, most bothersome at night. Previously followed by [facility]. He is relieved that his reflux is well controlled but has some concerns as he has been on PPI for 10 years given the long term risks of PPI treatment.  No evidence for GI bleeding, iron deficiency anemia, anorexia, unexplained weight loss, dysphagia, odynophagia, persistent vomiting, or gastrointestinal cancer in a first-degree relative.   He has had endoscopic evaluation twice, most recently, in 2017.  He had an EGD and colonoscopy at that time.  Pathology results show intestinal metaplasia consistent with Barrett's esophagus and chronic inflammation as well as fundic gland polyps.  There was no dysplasia.  Unfortunately the procedure notes from the EGD and the colonoscopy are not currently available.  Labs from 12/15/2020 showing normal CBC and a normal CMP except for glucose of 103  Sister with colon polyps. No other known family history of colon cancer or polyps. No known history of GI cancer in the family. His  father was GP in [place].   Past Medical History:  Diagnosis Date   GERD (gastroesophageal reflux disease)    Heart murmur    echo in college-do not hear now   Hypertension    SLAP (superior labrum from anterior to posterior) tear    Wears glasses    reading    Past Surgical History:  Procedure Laterality Date   APPENDECTOMY     COLONOSCOPY WITH PROPOFOL N/A 06/14/2015   Procedure: COLONOSCOPY WITH PROPOFOL;  Surgeon: Garlan Fair, MD;  Location: [facility];  Service: Endoscopy;  Laterality: N/A;   ESOPHAGOGASTRODUODENOSCOPY (EGD) WITH PROPOFOL N/A 06/14/2015   Procedure: ESOPHAGOGASTRODUODENOSCOPY (EGD) WITH PROPOFOL;  Surgeon: Garlan Fair, MD;  Location: [facility];  Service: Endoscopy;  Laterality: N/A;   LIPOMA EXCISION     SHOULDER ARTHROSCOPY WITH LABRAL REPAIR Right 01/07/2013   Procedure: SHOULDER ARTHROSCOPY WITH LABRAL REPAIR AND PARTIAL ROTATOR CUFF DEBRIDMENT;  Surgeon: Lorn Junes, MD;  Location: [facility];  Service: Orthopedics;  Laterality: Right;   TONSILLECTOMY       Current Outpatient Medications  Medication Sig Dispense Refill   amLODipine-valsartan (EXFORGE) 5-320 MG tablet Take 1 tablet by mouth daily.     omeprazole (PRILOSEC) 20 MG capsule Take 20 mg by mouth daily.     No current facility-administered medications for this visit.    Allergies as of 02/16/2021   (No Known Allergies)    History reviewed. No pertinent family history.  Review of Systems: 12 system ROS is negative except as noted above.   Physical Exam: General:   Alert,  well-nourished, pleasant and cooperative in  NAD Head:  Normocephalic and atraumatic. Eyes:  Sclera clear, no icterus.   Conjunctiva pink. Ears:  Normal auditory acuity. Nose:  No deformity, discharge,  or lesions. Mouth:  No deformity or lesions.   Neck:  Supple; no masses or thyromegaly. Lungs:  Clear throughout to auscultation.   No wheezes. Heart:  Regular rate and rhythm; no  murmurs. Abdomen:  Soft, nontender, nondistended, normal bowel sounds, no rebound or guarding. No hepatosplenomegaly.   Rectal:  Deferred  Msk:  Symmetrical. No boney deformities LAD: No inguinal or umbilical LAD Extremities:  No clubbing or edema. Neurologic:  Alert and  oriented x4;  grossly nonfocal Skin:  Intact without significant lesions or rashes. Psych:  Alert and cooperative. Normal mood and affect.    Christian Payson L. Tarri Glenn, MD, MPH 02/16/2021, 10:12 AM

## 2021-02-25 ENCOUNTER — Telehealth: Payer: Self-pay

## 2021-02-25 NOTE — Telephone Encounter (Signed)
Colonoscopy records from [facility] have been received and placed in Dr. Tarri Glenn in box for review.

## 2021-02-27 NOTE — Telephone Encounter (Signed)
Thank you :)

## 2021-03-29 ENCOUNTER — Telehealth: Payer: Self-pay | Admitting: Gastroenterology

## 2021-03-29 ENCOUNTER — Encounter: Payer: Self-pay | Admitting: Gastroenterology

## 2021-03-29 NOTE — Telephone Encounter (Signed)
Pt has been scheduled for 05/12/21 at 8:00am. Thank you.

## 2021-03-29 NOTE — Telephone Encounter (Signed)
Inbound call from pt stating that he is due for an upper endo. He was wanting to know before scheduling can he do his colonoscopy as well. Please advise. Thank you.

## 2021-03-30 NOTE — Telephone Encounter (Signed)
Prep instructions sent via MyChart

## 2021-05-12 ENCOUNTER — Encounter: Payer: Self-pay | Admitting: Gastroenterology

## 2021-05-12 ENCOUNTER — Ambulatory Visit (AMBULATORY_SURGERY_CENTER): Payer: BC Managed Care – PPO | Admitting: Gastroenterology

## 2021-05-12 VITALS — BP 108/72 | HR 56 | Temp 97.5°F | Resp 10 | Ht 75.0 in | Wt 217.0 lb

## 2021-05-12 DIAGNOSIS — Z1211 Encounter for screening for malignant neoplasm of colon: Secondary | ICD-10-CM | POA: Diagnosis not present

## 2021-05-12 DIAGNOSIS — K317 Polyp of stomach and duodenum: Secondary | ICD-10-CM

## 2021-05-12 DIAGNOSIS — D122 Benign neoplasm of ascending colon: Secondary | ICD-10-CM | POA: Diagnosis not present

## 2021-05-12 DIAGNOSIS — Z8371 Family history of colonic polyps: Secondary | ICD-10-CM | POA: Diagnosis not present

## 2021-05-12 DIAGNOSIS — K219 Gastro-esophageal reflux disease without esophagitis: Secondary | ICD-10-CM

## 2021-05-12 DIAGNOSIS — K2270 Barrett's esophagus without dysplasia: Secondary | ICD-10-CM

## 2021-05-12 DIAGNOSIS — D123 Benign neoplasm of transverse colon: Secondary | ICD-10-CM | POA: Diagnosis not present

## 2021-05-12 DIAGNOSIS — K3189 Other diseases of stomach and duodenum: Secondary | ICD-10-CM | POA: Diagnosis not present

## 2021-05-12 MED ORDER — SODIUM CHLORIDE 0.9 % IV SOLN: 500.0000 mL | Freq: Once | INTRAVENOUS | Status: DC

## 2021-05-12 NOTE — Patient Instructions (Signed)
Await pathology results. ? ?Handouts on polyps, gastritis, and GERD given. ? ?YOU HAD AN ENDOSCOPIC PROCEDURE TODAY AT [facility]:   Refer to the procedure report that was given to you for any specific questions about what was found during the examination.  If the procedure report does not answer your questions, please call your gastroenterologist to clarify.  If you requested that your care partner not be given the details of your procedure findings, then the procedure report has been included in a sealed envelope for you to review at your convenience later. ? ?YOU SHOULD EXPECT: Some feelings of bloating in the abdomen. Passage of more gas than usual.  Walking can help get rid of the air that was put into your GI tract during the procedure and reduce the bloating. If you had a lower endoscopy (such as a colonoscopy or flexible sigmoidoscopy) you may notice spotting of blood in your stool or on the toilet paper. If you underwent a bowel prep for your procedure, you may not have a normal bowel movement for a few days. ? ?Please Note:  You might notice some irritation and congestion in your nose or some drainage.  This is from the oxygen used during your procedure.  There is no need for concern and it should clear up in a day or so. ? ?SYMPTOMS TO REPORT IMMEDIATELY: ? ?Following lower endoscopy (colonoscopy or flexible sigmoidoscopy): ? Excessive amounts of blood in the stool ? Significant tenderness or worsening of abdominal pains ? Swelling of the abdomen that is new, acute ? Fever of 100?F or higher ? ?Following upper endoscopy (EGD) ? Vomiting of blood or coffee ground material ? New chest pain or pain under the shoulder blades ? Painful or persistently difficult swallowing ? New shortness of breath ? Fever of 100?F or higher ? Black, tarry-looking stools ? ?For urgent or emergent issues, a gastroenterologist can be reached at any hour by calling [phone]. ?Do not use MyChart messaging  for urgent concerns.  ? ? ?DIET:  We do recommend a small meal at first, but then you may proceed to your regular diet.  Drink plenty of fluids but you should avoid alcoholic beverages for 24 hours. ? ?ACTIVITY:  You should plan to take it easy for the rest of today and you should NOT DRIVE or use heavy machinery until tomorrow (because of the sedation medicines used during the test).   ? ?FOLLOW UP: ?Our staff will call the number listed on your records 48-72 hours following your procedure to check on you and address any questions or concerns that you may have regarding the information given to you following your procedure. If we do not reach you, we will leave a message.  We will attempt to reach you two times.  During this call, we will ask if you have developed any symptoms of COVID 19. If you develop any symptoms (ie: fever, flu-like symptoms, shortness of breath, cough etc.) before then, please call [phone].  If you test positive for Covid 19 in the 2 weeks post procedure, please call and report this information to [place].   ? ?If any biopsies were taken you will be contacted by phone or by letter within the next 1-3 weeks.  Please call us at [phone] if you have not heard about the biopsies in 3 weeks.  ? ? ?SIGNATURES/CONFIDENTIALITY: ?You and/or your care partner have signed paperwork which will be entered into your electronic medical record.  These signatures  attest to the fact that that the information above on your After Visit Summary has been reviewed and is understood.  Full responsibility of the confidentiality of this discharge information lies with you and/or your care-partner.  ?

## 2021-05-12 NOTE — Progress Notes (Signed)
Called to room to assist during endoscopic procedure.  Patient ID and intended procedure confirmed with present staff. Received instructions for my participation in the procedure from the performing physician.  

## 2021-05-12 NOTE — Progress Notes (Signed)
0750 Robinul 0.1 mg IV given due large amount of secretions upon assessment.  MD made aware, vss  ?

## 2021-05-12 NOTE — Op Note (Addendum)
[facility] ?Patient Name: Christian Nunez ?Procedure Date: 05/12/2021 7:51 AM ?MRN: [id number] ?Endoscopist: Thornton Park MD, MD ?Age: 61 ?Referring MD:  ?Date of Birth: [birth year] ?Gender: Male ?Account #: [id number] ?Procedure:                Upper GI endoscopy ?Indications:              Follow-up of Barrett's esophagus ?                          Last EGD 2017 ?Medicines:                Monitored Anesthesia Care ?Procedure:                Pre-Anesthesia Assessment: ?                          - Prior to the procedure, a History and Physical  ?                          was performed, and patient medications and  ?                          allergies were reviewed. The patient's tolerance of  ?                          previous anesthesia was also reviewed. The risks  ?                          and benefits of the procedure and the sedation  ?                          options and risks were discussed with the patient.  ?                          All questions were answered, and informed consent  ?                          was obtained. Prior Anticoagulants: The patient has  ?                          taken no previous anticoagulant or antiplatelet  ?                          agents. ASA Grade Assessment: II - A patient with  ?                          mild systemic disease. After reviewing the risks  ?                          and benefits, the patient was deemed in  ?                          satisfactory condition to undergo the procedure. ?  After obtaining informed consent, the endoscope was  ?                          passed under direct vision. Throughout the  ?                          procedure, the patient's blood pressure, pulse, and  ?                          oxygen saturations were monitored continuously. The  ?                          GIF [id number] #[id number] was introduced through the  ?                          mouth, and advanced to the third part of duodenum.  ?                           The upper GI endoscopy was accomplished without  ?                          difficulty. The patient tolerated the procedure  ?                          well. ?Scope In: ?Scope Out: ?Findings:                 There were esophageal mucosal changes suspicious  ?                          for short-segment Barrett's esophagus present in  ?                          the lower third of the esophagus. There is a single  ?                          tongue located 42 cm from the incisors with less  ?                          then 1<cm of mucosal changes. The maximum  ?                          longitudinal extent of these mucosal changes was 1  ?                          cm in length. Mucosa was biopsied with a cold  ?                          forceps for histology in 4 quadrants at intervals  ?                          of 0.5 cm. One specimen bottle was sent to  ?  pathology. Estimated blood loss was minimal. ?                          Multiple small sessile polyps with no stigmata of  ?                          recent bleeding were found in the gastric fundus.  ?                          Biopsies of four polyps were taken with a cold  ?                          forceps for histology. Estimated blood loss was  ?                          minimal. ?                          Patchy mildly erythematous mucosa without bleeding  ?                          was found in the gastric body and in the gastric  ?                          antrum. Biopsies were taken from the antum, body,  ?                          and fundus with a cold forceps for histology.  ?                          Estimated blood loss was minimal. ?                          The examined duodenum was normal. ?Complications:            No immediate complications. Estimated blood loss:  ?                          Minimal. ?Estimated Blood Loss:     Estimated blood loss was minimal. ?Impression:               - Esophageal mucosal  changes suspicious for  ?                          short-segment Barrett's esophagus. Biopsied. ?                          - Multiple gastric polyps. Biopsied. ?                          - Erythematous mucosa in the gastric body and  ?                          antrum. Biopsied. ?                          -  Normal examined duodenum. ?Recommendation:           - Patient has a contact number available for  ?                          emergencies. The signs and symptoms of potential  ?                          delayed complications were discussed with the  ?                          patient. Return to normal activities tomorrow.  ?                          Written discharge instructions were provided to the  ?                          patient. ?                          - Resume previous diet. ?                          - Continue present medications including omeprazole. ?                          - Await pathology results. ?                          - Repeat upper endoscopy in 5 years for  ?                          surveillance, earlier with new symptoms. ?Thornton Park MD, MD ?05/12/2021 8:38:50 AM ?This report has been signed electronically. ?

## 2021-05-12 NOTE — Op Note (Signed)
[facility] ?Patient Name: Christian Nunez ?Procedure Date: 05/12/2021 7:50 AM ?MRN: [id number] ?Endoscopist: Thornton Park MD, MD ?Age: 61 ?Referring MD:  ?Date of Birth: Feb 01, 1961 ?Gender: Male ?Account #: [id number] ?Procedure:                Colonoscopy ?Indications:              Screening for colorectal malignant neoplasm ?                          Colonoscopy 2012 and 2017 with Dr. Earle Gell:  ?                          normal colon. Surveillance recommended in 5 years ?                          Family history of colon polyps ?Medicines:                Monitored Anesthesia Care ?Procedure:                Pre-Anesthesia Assessment: ?                          - Prior to the procedure, a History and Physical  ?                          was performed, and patient medications and  ?                          allergies were reviewed. The patient's tolerance of  ?                          previous anesthesia was also reviewed. The risks  ?                          and benefits of the procedure and the sedation  ?                          options and risks were discussed with the patient.  ?                          All questions were answered, and informed consent  ?                          was obtained. Prior Anticoagulants: The patient has  ?                          taken no previous anticoagulant or antiplatelet  ?                          agents. ASA Grade Assessment: II - A patient with  ?                          mild systemic disease. After reviewing the risks  ?  and benefits, the patient was deemed in  ?                          satisfactory condition to undergo the procedure. ?                          After obtaining informed consent, the colonoscope  ?                          was passed under direct vision. Throughout the  ?                          procedure, the patient's blood pressure, pulse, and  ?                          oxygen saturations were monitored  continuously. The  ?                          Olympus CF-[id number] (#[id number]) Colonoscope was  ?                          introduced through the anus and advanced to the 3  ?                          cm into the ileum. A second forward view of the  ?                          right colon was performed. The colonoscopy was  ?                          performed without difficulty. The patient tolerated  ?                          the procedure well. The quality of the bowel  ?                          preparation was good. The terminal ileum, ileocecal  ?                          valve, appendiceal orifice, and rectum were  ?                          photographed. ?Scope In: 8:17:20 AM ?Scope Out: 8:33:31 AM ?Scope Withdrawal Time: 0 hours 14 minutes 29 seconds  ?Total Procedure Duration: 0 hours 16 minutes 11 seconds  ?Findings:                 The perianal and digital rectal examinations were  ?                          normal. ?                          Two sessile polyps were found in the transverse  ?  colon. The polyps were 4 to 6 mm in size. These  ?                          polyps were removed with a cold snare. Resection  ?                          and retrieval were complete. Estimated blood loss  ?                          was minimal. ?                          Three sessile polyps were found in the ascending  ?                          colon. The polyps were 2 to 3 mm in size. These  ?                          polyps were removed with a cold snare. Resection  ?                          and retrieval were complete. Estimated blood loss  ?                          was minimal. ?                          The exam was otherwise without abnormality on  ?                          direct and retroflexion views. ?Complications:            No immediate complications. Estimated blood loss:  ?                          Minimal. ?Estimated Blood Loss:     Estimated blood loss was  minimal. ?Impression:               - Two 4 to 6 mm polyps in the transverse colon,  ?                          removed with a cold snare. Resected and retrieved. ?                          - Three 2 to 3 mm polyps in the ascending colon,  ?                          removed with a cold snare. Resected and retrieved. ?                          - The examination was otherwise normal on direct  ?                          and retroflexion views. ?Recommendation:           -  Patient has a contact number available for  ?                          emergencies. The signs and symptoms of potential  ?                          delayed complications were discussed with the  ?                          patient. Return to normal activities tomorrow.  ?                          Written discharge instructions were provided to the  ?                          patient. ?                          - Resume previous diet. ?                          - Continue present medications. ?                          - Await pathology results. ?                          - Repeat colonoscopy date to be determined after  ?                          pending pathology results are reviewed for  ?                          surveillance. ?                          - Emerging evidence supports eating a diet of  ?                          fruits, vegetables, grains, calcium, and yogurt  ?                          while reducing red meat and alcohol may reduce the  ?                          risk of colon cancer. ?                          - Thank you for allowing me to be involved in your  ?                          colon cancer prevention. ?Thornton Park MD, MD ?05/12/2021 8:45:12 AM ?This report has been signed electronically. ?

## 2021-05-12 NOTE — Progress Notes (Signed)
Report given to PACU, vss 

## 2021-05-12 NOTE — Progress Notes (Signed)
? ?Referring Provider: [place], Blondell Reveal, MD ?Primary Care Physician:  [place], Raymond J, MD ? ?Indication for EGD:   ?Barrett's Esophagus without dysplasia in the setting of chronic reflux: Continue omeprazole. Plan EGD with biopsies.  ? ?Indication for Colonoscopy:    ?Family history of colon polyps: Last colonoscopy 2017. Due for surveillance.  ? ? ?IMPRESSION:  ?Appropriate candidate for monitored anesthesia care ? ?PLAN: ?EGD Colonoscopy in the [facility] today ? ? ?HPI: Christian Nunez is a 61 y.o. male presents for EGD and colonoscopy. ? ?He has a longstanding history of reflux well-controlled with omeprazole 20 mg QD.  Symptoms are predominant brash and heartburn, most bothersome at night. Previously followed by [facility]. He is relieved that his reflux is well controlled but has some concerns as he has been on PPI for 10 years given the long term risks of PPI treatment. ?  ?No evidence for GI bleeding, iron deficiency anemia, anorexia, unexplained weight loss, dysphagia, odynophagia, persistent vomiting, or gastrointestinal cancer in a first-degree relative.  ?  ?He has had endoscopic evaluation twice, most recently, in 2017.  He had an EGD and colonoscopy at that time.  Pathology results show intestinal metaplasia consistent with Barrett's esophagus and chronic inflammation as well as fundic gland polyps.  There was no dysplasia.  Unfortunately the procedure notes from the EGD and the colonoscopy are not currently available. ?  ?Labs from 12/15/2020 showing normal CBC and a normal CMP except for glucose of 103 ?  ?Sister with colon polyps. No other known family history of colon cancer or polyps. No known history of GI cancer in the family. His father was GP in [place].  ? ? ?Past Medical History:  ?Diagnosis Date  ? GERD (gastroesophageal reflux disease)   ? Heart murmur   ? echo in college-do not hear now  ? Hypertension   ? SLAP (superior labrum from anterior to posterior) tear   ? Wears glasses   ? reading   ? ? ?Past Surgical History:  ?Procedure Laterality Date  ? APPENDECTOMY    ? COLONOSCOPY    ? COLONOSCOPY WITH PROPOFOL N/A 06/14/2015  ? Procedure: COLONOSCOPY WITH PROPOFOL;  Surgeon: Garlan Fair, MD;  Location: [facility];  Service: Endoscopy;  Laterality: N/A;  ? ESOPHAGOGASTRODUODENOSCOPY (EGD) WITH PROPOFOL N/A 06/14/2015  ? Procedure: ESOPHAGOGASTRODUODENOSCOPY (EGD) WITH PROPOFOL;  Surgeon: Garlan Fair, MD;  Location: [facility];  Service: Endoscopy;  Laterality: N/A;  ? LIPOMA EXCISION    ? SHOULDER ARTHROSCOPY WITH LABRAL REPAIR Right 01/07/2013  ? Procedure: SHOULDER ARTHROSCOPY WITH LABRAL REPAIR AND PARTIAL ROTATOR CUFF DEBRIDMENT;  Surgeon: Lorn Junes, MD;  Location: [facility];  Service: Orthopedics;  Laterality: Right;  ? TONSILLECTOMY    ? UPPER GASTROINTESTINAL ENDOSCOPY    ? ? ?Current Outpatient Medications  ?Medication Sig Dispense Refill  ? amLODipine-valsartan (EXFORGE) 5-320 MG tablet Take 1 tablet by mouth daily.    ? omeprazole (PRILOSEC) 20 MG capsule Take 20 mg by mouth daily.    ? ?Current Facility-Administered Medications  ?Medication Dose Route Frequency Provider Last Rate Last Admin  ? 0.9 %  sodium chloride infusion  500 mL Intravenous Once Thornton Park, MD      ? ? ?Allergies as of 05/12/2021  ? (No Known Allergies)  ? ? ?Family History  ?Problem Relation Age of Onset  ? Colon polyps Sister   ? Colon cancer Neg Hx   ? Esophageal cancer Neg Hx   ?  Gastric cancer Neg Hx   ? Rectal cancer Neg Hx   ? Stomach cancer Neg Hx   ? ? ? ?Physical Exam: ?General:   Alert,  well-nourished, pleasant and cooperative in NAD ?Head:  Normocephalic and atraumatic. ?Eyes:  Sclera clear, no icterus.   Conjunctiva pink. ?Mouth:  No deformity or lesions.   ?Neck:  Supple; no masses or thyromegaly. ?Lungs:  Clear throughout to auscultation.   No wheezes. ?Heart:  Regular rate and rhythm; no murmurs. ?Abdomen:  Soft, non-tender, nondistended, normal bowel sounds, no  rebound or guarding.  ?Msk:  Symmetrical. No boney deformities ?LAD: No inguinal or umbilical LAD ?Extremities:  No clubbing or edema. ?Neurologic:  Alert and  oriented x4;  grossly nonfocal ?Skin:  No obvious rash or bruise. ?Psych:  Alert and cooperative. Normal mood and affect. ? ? ? ? ?Studies/Results: ?No results found. ? ? ? ?Elyas Villamor L. Tarri Glenn, MD, MPH ?05/12/2021, 8:00 AM ? ? ? ?  ?

## 2021-05-17 ENCOUNTER — Telehealth: Payer: Self-pay | Admitting: *Deleted

## 2021-05-17 ENCOUNTER — Telehealth: Payer: Self-pay

## 2021-05-17 NOTE — Telephone Encounter (Signed)
?  Follow up Call- ? ? ?  05/12/2021  ?  7:14 AM  ?Call back number  ?Post procedure Call Back phone  # [phone]  ?Permission to leave phone message Yes  ?  ? ?Patient questions: ? ?Do you have a fever, pain , or abdominal swelling? No. ?Pain Score  0 * ? ?Have you tolerated food without any problems? Yes.   ? ?Have you been able to return to your normal activities? Yes.   ? ?Do you have any questions about your discharge instructions: ?Diet   No. ?Medications  No. ?Follow up visit  No. ? ?Do you have questions or concerns about your Care? No. ? ?Actions: ?* If pain score is 4 or above: ?No action needed, pain <4. ? ? ?

## 2021-05-17 NOTE — Telephone Encounter (Signed)
No answer on first attempt follow up call. Left message.  ?

## 2021-05-25 ENCOUNTER — Encounter: Payer: Self-pay | Admitting: Gastroenterology

## 2021-06-14 ENCOUNTER — Ambulatory Visit (INDEPENDENT_AMBULATORY_CARE_PROVIDER_SITE_OTHER): Payer: BC Managed Care – PPO | Admitting: Family Medicine

## 2021-06-14 ENCOUNTER — Encounter (HOSPITAL_BASED_OUTPATIENT_CLINIC_OR_DEPARTMENT_OTHER): Payer: Self-pay | Admitting: Family Medicine

## 2021-06-14 VITALS — BP 113/78 | HR 76 | Temp 97.8°F | Ht 75.0 in | Wt 221.0 lb

## 2021-06-14 DIAGNOSIS — Z125 Encounter for screening for malignant neoplasm of prostate: Secondary | ICD-10-CM

## 2021-06-14 DIAGNOSIS — R7989 Other specified abnormal findings of blood chemistry: Secondary | ICD-10-CM | POA: Diagnosis not present

## 2021-06-14 DIAGNOSIS — Z0000 Encounter for general adult medical examination without abnormal findings: Secondary | ICD-10-CM | POA: Diagnosis not present

## 2021-06-14 NOTE — Progress Notes (Signed)
?Subjective:   ? ?CC: Annual Physical Exam ? ?HPI:  ?Christian Nunez is a 61 y.o. presenting for annual physical ? ?I reviewed the past medical history, family history, social history, surgical history, and allergies today and no changes were needed.  Please see the problem list section below in epic for further details. ? ?Past Medical History: ?Past Medical History:  ?Diagnosis Date  ? GERD (gastroesophageal reflux disease)   ? Heart murmur   ? echo in college-do not hear now  ? Hypertension   ? SLAP (superior labrum from anterior to posterior) tear   ? Wears glasses   ? reading  ? ?Past Surgical History: ?Past Surgical History:  ?Procedure Laterality Date  ? APPENDECTOMY    ? COLONOSCOPY    ? COLONOSCOPY WITH PROPOFOL N/A 06/14/2015  ? Procedure: COLONOSCOPY WITH PROPOFOL;  Surgeon: Garlan Fair, MD;  Location: [facility];  Service: Endoscopy;  Laterality: N/A;  ? ESOPHAGOGASTRODUODENOSCOPY (EGD) WITH PROPOFOL N/A 06/14/2015  ? Procedure: ESOPHAGOGASTRODUODENOSCOPY (EGD) WITH PROPOFOL;  Surgeon: Garlan Fair, MD;  Location: [facility];  Service: Endoscopy;  Laterality: N/A;  ? LIPOMA EXCISION    ? SHOULDER ARTHROSCOPY WITH LABRAL REPAIR Right 01/07/2013  ? Procedure: SHOULDER ARTHROSCOPY WITH LABRAL REPAIR AND PARTIAL ROTATOR CUFF DEBRIDMENT;  Surgeon: Lorn Junes, MD;  Location: [facility];  Service: Orthopedics;  Laterality: Right;  ? TONSILLECTOMY    ? UPPER GASTROINTESTINAL ENDOSCOPY    ? ?Social History: ?Social History  ? ?Socioeconomic History  ? Marital status: Married  ?  Spouse name: Not on file  ? Number of children: Not on file  ? Years of education: Not on file  ? Highest education level: Not on file  ?Occupational History  ? Not on file  ?Tobacco Use  ? Smoking status: Never  ? Smokeless tobacco: Never  ?Vaping Use  ? Vaping Use: Never used  ?Substance and Sexual Activity  ? Alcohol use: Yes  ?  Comment: occ  ? Drug use: No  ? Sexual activity: Yes  ?  Birth  control/protection: None  ?Other Topics Concern  ? Not on file  ?Social History Narrative  ? ** Merged History Encounter **  ?    ? ?Social Determinants of Health  ? ?Financial Resource Strain: Not on file  ?Food Insecurity: Not on file  ?Transportation Needs: Not on file  ?Physical Activity: Not on file  ?Stress: Not on file  ?Social Connections: Not on file  ? ?Family History: ?Family History  ?Problem Relation Age of Onset  ? Colon polyps Sister   ? Colon cancer Neg Hx   ? Esophageal cancer Neg Hx   ? Gastric cancer Neg Hx   ? Rectal cancer Neg Hx   ? Stomach cancer Neg Hx   ? ?Allergies: ?No Known Allergies ?Medications: See med rec. ? ?Review of Systems: No headache, visual changes, nausea, vomiting, diarrhea, constipation, dizziness, abdominal pain, skin rash, fevers, chills, night sweats, swollen lymph nodes, weight loss, chest pain, body aches, joint swelling, muscle aches, shortness of breath, mood changes, visual or auditory hallucinations. ? ?Objective:   ? ?BP 113/78   Pulse 76   Temp 97.8 ?F (36.6 ?C) (Oral)   Ht '6\' 3"'$  (1.905 m)   Wt 221 lb (100.2 kg)   SpO2 100%   BMI 27.62 kg/m?  ? ?General: Well Developed, well nourished, and in no acute distress.  ?Neuro: Alert and oriented x3, extra-ocular muscles intact, sensation grossly intact. Cranial nerves II  through XII are intact, motor, sensory, and coordinative functions are all intact. ?HEENT: Normocephalic, atraumatic, pupils equal round reactive to light, neck supple, no masses, no lymphadenopathy, thyroid nonpalpable. Oropharynx, nasopharynx, external ear canals are unremarkable. ?Skin: Warm and dry, no rashes noted.  ?Cardiac: Regular rate and rhythm, no murmurs rubs or gallops.  ?Respiratory: Clear to auscultation bilaterally. Not using accessory muscles, speaking in full sentences.  ?Abdominal: Soft, nontender, nondistended, positive bowel sounds, no masses, no organomegaly.  ?Musculoskeletal: Shoulder, elbow, wrist, hip, knee, ankle stable,  and with full range of motion. ? ?Impression and Recommendations:   ? ?Wellness examination ?Routine HCM labs ordered. HCM reviewed/discussed. Anticipatory guidance regarding healthy weight, lifestyle and choices given. ?Recommend healthy diet.  Recommend approximately 150 minutes/week of moderate intensity exercise ?Recommend regular dental and vision exams ?Always use seatbelt/lap and shoulder restraints ?Recommend using smoke alarms and checking batteries at least twice a year ?Recommend using sunscreen when outside ?Discussed colon cancer screening recommendations, options.  Patient had recent colonoscopy, recommended repeating in 10 years ?Discussed recommendations for shingles vaccine.  Patient declines at this time - has received first dose, felt that he reacted strongly and so has not had second dose yet, will consider ?Discussed tetanus immunization recommendations, patient is UTD ?The natural history of prostate cancer and ongoing controversy regarding screening and potential treatment outcomes of prostate cancer has been discussed with the patient. The meaning of a false positive PSA and a false negative PSA has been discussed. He indicates understanding of the limitations of this screening test and wishes to proceed with screening PSA testing. ? ?Low testosterone ?History of low testosterone.  Patient has been utilizing testosterone pellets for treatment.  He would like to check testosterone levels at this time to assess current status, order placed ? ?Plan for follow-up in 1 year or sooner as needed ?Recommend the patient keep a close eye on blood pressure -if having issues with persistently high or low readings, recommend returning to office for further evaluation ? ? ?___________________________________________ ?Aloysius Heinle [place], MD, ABFM, CAQSM ?Primary Care and Sports Medicine ?[facility] ?

## 2021-06-14 NOTE — Assessment & Plan Note (Signed)
History of low testosterone.  Patient has been utilizing testosterone pellets for treatment.  He would like to check testosterone levels at this time to assess current status, order placed ?

## 2021-06-14 NOTE — Assessment & Plan Note (Signed)
Routine HCM labs ordered. HCM reviewed/discussed. Anticipatory guidance regarding healthy weight, lifestyle and choices given. ?Recommend healthy diet.  Recommend approximately 150 minutes/week of moderate intensity exercise ?Recommend regular dental and vision exams ?Always use seatbelt/lap and shoulder restraints ?Recommend using smoke alarms and checking batteries at least twice a year ?Recommend using sunscreen when outside ?Discussed colon cancer screening recommendations, options.  Patient had recent colonoscopy, recommended repeating in 10 years ?Discussed recommendations for shingles vaccine.  Patient declines at this time - has received first dose, felt that he reacted strongly and so has not had second dose yet, will consider ?Discussed tetanus immunization recommendations, patient is UTD ?The natural history of prostate cancer and ongoing controversy regarding screening and potential treatment outcomes of prostate cancer has been discussed with the patient. The meaning of a false positive PSA and a false negative PSA has been discussed. He indicates understanding of the limitations of this screening test and wishes to proceed with screening PSA testing. ?

## 2021-06-15 LAB — CBC WITH DIFFERENTIAL/PLATELET
Basophils Absolute: 0 10*3/uL (ref 0.0–0.2)
Basos: 0 %
EOS (ABSOLUTE): 0.2 10*3/uL (ref 0.0–0.4)
Eos: 2 %
Hematocrit: 44.1 % (ref 37.5–51.0)
Hemoglobin: 15.7 g/dL (ref 13.0–17.7)
Immature Grans (Abs): 0 10*3/uL (ref 0.0–0.1)
Immature Granulocytes: 0 %
Lymphocytes Absolute: 1.5 10*3/uL (ref 0.7–3.1)
Lymphs: 16 %
MCH: 32.9 pg (ref 26.6–33.0)
MCHC: 35.6 g/dL (ref 31.5–35.7)
MCV: 93 fL (ref 79–97)
Monocytes Absolute: 0.8 10*3/uL (ref 0.1–0.9)
Monocytes: 9 %
Neutrophils Absolute: 6.7 10*3/uL (ref 1.4–7.0)
Neutrophils: 73 %
Platelets: 202 10*3/uL (ref 150–450)
RBC: 4.77 x10E6/uL (ref 4.14–5.80)
RDW: 12.6 % (ref 11.6–15.4)
WBC: 9.3 10*3/uL (ref 3.4–10.8)

## 2021-06-15 LAB — COMPREHENSIVE METABOLIC PANEL
ALT: 25 IU/L (ref 0–44)
AST: 23 IU/L (ref 0–40)
Albumin/Globulin Ratio: 2.1 (ref 1.2–2.2)
Albumin: 5 g/dL — ABNORMAL HIGH (ref 3.8–4.9)
Alkaline Phosphatase: 48 IU/L (ref 44–121)
BUN/Creatinine Ratio: 11 (ref 9–20)
BUN: 12 mg/dL (ref 6–24)
Bilirubin Total: 1 mg/dL (ref 0.0–1.2)
CO2: 24 mmol/L (ref 20–29)
Calcium: 9.6 mg/dL (ref 8.7–10.2)
Chloride: 99 mmol/L (ref 96–106)
Creatinine, Ser: 1.05 mg/dL (ref 0.76–1.27)
Globulin, Total: 2.4 g/dL (ref 1.5–4.5)
Glucose: 115 mg/dL — ABNORMAL HIGH (ref 70–99)
Potassium: 5.7 mmol/L — ABNORMAL HIGH (ref 3.5–5.2)
Sodium: 137 mmol/L (ref 134–144)
Total Protein: 7.4 g/dL (ref 6.0–8.5)
eGFR: 82 mL/min/{1.73_m2} (ref >59)

## 2021-06-15 LAB — TESTOSTERONE,FREE AND TOTAL
Testosterone, Free: 12.2 pg/mL (ref 7.2–24.0)
Testosterone: 677 ng/dL (ref 264–916)

## 2021-06-15 LAB — LIPID PANEL
Chol/HDL Ratio: 3.5 ratio (ref 0.0–5.0)
Cholesterol, Total: 227 mg/dL — ABNORMAL HIGH (ref 100–199)
HDL: 65 mg/dL (ref >39)
LDL Chol Calc (NIH): 147 mg/dL — ABNORMAL HIGH (ref 0–99)
Triglycerides: 84 mg/dL (ref 0–149)
VLDL Cholesterol Cal: 15 mg/dL (ref 5–40)

## 2021-06-15 LAB — PSA, TOTAL AND FREE
PSA, Free Pct: 23 %
PSA, Free: 1.06 ng/mL
Prostate Specific Ag, Serum: 4.6 ng/mL — ABNORMAL HIGH (ref 0.0–4.0)

## 2021-06-15 LAB — HEMOGLOBIN A1C
Est. average glucose Bld gHb Est-mCnc: 114 mg/dL
Hgb A1c MFr Bld: 5.6 % (ref 4.8–5.6)

## 2021-06-15 LAB — TSH RFX ON ABNORMAL TO FREE T4: TSH: 1.79 u[IU]/mL (ref 0.450–4.500)

## 2021-06-16 ENCOUNTER — Other Ambulatory Visit (HOSPITAL_BASED_OUTPATIENT_CLINIC_OR_DEPARTMENT_OTHER): Payer: Self-pay | Admitting: Family Medicine

## 2021-06-16 DIAGNOSIS — E875 Hyperkalemia: Secondary | ICD-10-CM

## 2021-08-22 DIAGNOSIS — M1711 Unilateral primary osteoarthritis, right knee: Secondary | ICD-10-CM | POA: Diagnosis not present

## 2021-09-20 ENCOUNTER — Encounter (HOSPITAL_BASED_OUTPATIENT_CLINIC_OR_DEPARTMENT_OTHER): Payer: Self-pay

## 2021-09-20 ENCOUNTER — Encounter (HOSPITAL_BASED_OUTPATIENT_CLINIC_OR_DEPARTMENT_OTHER): Payer: Self-pay | Admitting: Family Medicine

## 2021-09-20 NOTE — Telephone Encounter (Signed)
Pts wife called on 8/15 @ 4:35 regarding the last labs pt had done on 5/11 read off what provider said word for word and that we tried contacting pt but did not hear back and she made the nurse visit for labs and is wanting referral put in for Urology. Placing urology referral now.

## 2021-09-21 ENCOUNTER — Other Ambulatory Visit (HOSPITAL_BASED_OUTPATIENT_CLINIC_OR_DEPARTMENT_OTHER): Payer: Self-pay | Admitting: Family Medicine

## 2021-09-21 ENCOUNTER — Ambulatory Visit (HOSPITAL_BASED_OUTPATIENT_CLINIC_OR_DEPARTMENT_OTHER): Payer: BC Managed Care – PPO

## 2021-09-21 DIAGNOSIS — R7989 Other specified abnormal findings of blood chemistry: Secondary | ICD-10-CM

## 2021-09-21 DIAGNOSIS — E875 Hyperkalemia: Secondary | ICD-10-CM

## 2021-09-22 LAB — PSA TOTAL (REFLEX TO FREE): Prostate Specific Ag, Serum: 0.9 ng/mL (ref 0.0–4.0)

## 2021-10-12 ENCOUNTER — Other Ambulatory Visit (HOSPITAL_BASED_OUTPATIENT_CLINIC_OR_DEPARTMENT_OTHER): Payer: Self-pay

## 2021-10-12 DIAGNOSIS — I10 Essential (primary) hypertension: Secondary | ICD-10-CM

## 2021-10-12 MED ORDER — AMLODIPINE BESYLATE-VALSARTAN 5-320 MG PO TABS: 1.0000 | ORAL_TABLET | Freq: Every day | ORAL | 1 refills | Status: DC

## 2021-10-21 DIAGNOSIS — D2262 Melanocytic nevi of left upper limb, including shoulder: Secondary | ICD-10-CM | POA: Diagnosis not present

## 2021-10-21 DIAGNOSIS — D485 Neoplasm of uncertain behavior of skin: Secondary | ICD-10-CM | POA: Diagnosis not present

## 2021-10-21 DIAGNOSIS — L821 Other seborrheic keratosis: Secondary | ICD-10-CM | POA: Diagnosis not present

## 2021-10-21 DIAGNOSIS — Z1283 Encounter for screening for malignant neoplasm of skin: Secondary | ICD-10-CM | POA: Diagnosis not present

## 2021-10-21 DIAGNOSIS — X32XXXA Exposure to sunlight, initial encounter: Secondary | ICD-10-CM | POA: Diagnosis not present

## 2021-10-21 DIAGNOSIS — L570 Actinic keratosis: Secondary | ICD-10-CM | POA: Diagnosis not present

## 2021-10-21 DIAGNOSIS — D225 Melanocytic nevi of trunk: Secondary | ICD-10-CM | POA: Diagnosis not present

## 2021-10-28 DIAGNOSIS — M1711 Unilateral primary osteoarthritis, right knee: Secondary | ICD-10-CM | POA: Diagnosis not present

## 2021-12-15 ENCOUNTER — Other Ambulatory Visit (HOSPITAL_BASED_OUTPATIENT_CLINIC_OR_DEPARTMENT_OTHER): Payer: Self-pay

## 2021-12-15 DIAGNOSIS — I10 Essential (primary) hypertension: Secondary | ICD-10-CM

## 2021-12-15 DIAGNOSIS — X32XXXD Exposure to sunlight, subsequent encounter: Secondary | ICD-10-CM | POA: Diagnosis not present

## 2021-12-15 DIAGNOSIS — L570 Actinic keratosis: Secondary | ICD-10-CM | POA: Diagnosis not present

## 2021-12-15 MED ORDER — AMLODIPINE BESYLATE-VALSARTAN 5-320 MG PO TABS: 1.0000 | ORAL_TABLET | Freq: Every day | ORAL | 3 refills | Status: DC

## 2022-04-06 DIAGNOSIS — X32XXXA Exposure to sunlight, initial encounter: Secondary | ICD-10-CM | POA: Diagnosis not present

## 2022-04-06 DIAGNOSIS — Z1283 Encounter for screening for malignant neoplasm of skin: Secondary | ICD-10-CM | POA: Diagnosis not present

## 2022-04-06 DIAGNOSIS — D225 Melanocytic nevi of trunk: Secondary | ICD-10-CM | POA: Diagnosis not present

## 2022-04-06 DIAGNOSIS — L821 Other seborrheic keratosis: Secondary | ICD-10-CM | POA: Diagnosis not present

## 2022-04-06 DIAGNOSIS — L820 Inflamed seborrheic keratosis: Secondary | ICD-10-CM | POA: Diagnosis not present

## 2022-04-06 DIAGNOSIS — L570 Actinic keratosis: Secondary | ICD-10-CM | POA: Diagnosis not present

## 2022-06-16 ENCOUNTER — Encounter (HOSPITAL_BASED_OUTPATIENT_CLINIC_OR_DEPARTMENT_OTHER): Payer: BC Managed Care – PPO | Admitting: Family Medicine

## 2022-06-19 ENCOUNTER — Encounter (HOSPITAL_BASED_OUTPATIENT_CLINIC_OR_DEPARTMENT_OTHER): Payer: Self-pay | Admitting: Family Medicine

## 2022-06-19 ENCOUNTER — Ambulatory Visit (INDEPENDENT_AMBULATORY_CARE_PROVIDER_SITE_OTHER): Payer: BC Managed Care – PPO | Admitting: Family Medicine

## 2022-06-19 VITALS — BP 126/80 | HR 66 | Ht 75.0 in | Wt 216.0 lb

## 2022-06-19 DIAGNOSIS — E291 Testicular hypofunction: Secondary | ICD-10-CM | POA: Diagnosis not present

## 2022-06-19 DIAGNOSIS — F40243 Fear of flying: Secondary | ICD-10-CM

## 2022-06-19 DIAGNOSIS — Z0000 Encounter for general adult medical examination without abnormal findings: Secondary | ICD-10-CM | POA: Diagnosis not present

## 2022-06-19 MED ORDER — SILDENAFIL CITRATE 25 MG PO TABS
50.0000 mg | ORAL_TABLET | Freq: Every day | ORAL | 1 refills | Status: DC | PRN
Start: 2022-06-19 — End: 2022-06-19

## 2022-06-19 MED ORDER — ALPRAZOLAM 0.25 MG PO TABS
0.2500 mg | ORAL_TABLET | Freq: Every evening | ORAL | 0 refills | Status: AC | PRN
Start: 2022-06-19 — End: ?

## 2022-06-19 MED ORDER — SILDENAFIL CITRATE 25 MG PO TABS
25.0000 mg | ORAL_TABLET | Freq: Every day | ORAL | 1 refills | Status: AC | PRN
Start: 2022-06-19 — End: ?

## 2022-06-19 NOTE — Progress Notes (Signed)
Complete physical exam  Patient: Christian Nunez   DOB: [birth year]   62 y.o. Male  MRN: [id number]  Subjective:    Christian Nunez is a 62 y.o. male who presents today for a complete physical exam. He reports consuming a general diet. Home exercise routine includes treadmill, walking 0.5 hrs per day, and doing push-ups. He generally feels well. He reports sleeping well. He does have additional problems to discuss today.   Barrett's esophagus: He has had endoscopic evaluation twice, most recently, in 2017.  He had an EGD and colonoscopy at that time.  Pathology results show intestinal metaplasia consistent with Barrett's esophagus and chronic inflammation as well as fundic gland polyps.    Omeprazole 20mg  has been on PPI for 10 years- concerned about long-term risks of PPI use  Recent EGD and colonoscopy performed 05/12/2021, recall 5 years    Depression screenings:    06/19/2022   11:09 AM 12/15/2020    2:04 PM  Depression screen PHQ 2/9  Decreased Interest 0 0  Down, Depressed, Hopeless 0 0  PHQ - 2 Score 0 0    Anxiety screenings:     No data to display         Vision: about 2 years ago, wears readers- no change in vision  Dentist: attends twice a year   Patient Care Team: [place], Buren Kos, MD as PCP - General (Family Medicine)   Outpatient Medications Prior to Visit  Medication Sig   amLODipine-valsartan (EXFORGE) 5-320 MG tablet Take 1 tablet by mouth daily.   omeprazole (PRILOSEC) 20 MG capsule Take 20 mg by mouth daily.   No facility-administered medications prior to visit.    Review of Systems  Constitutional:  Negative for malaise/fatigue.  Eyes:  Negative for blurred vision and double vision.  Respiratory:  Negative for cough and shortness of breath.   Cardiovascular:  Negative for chest pain and palpitations.  Gastrointestinal:  Negative for abdominal pain, nausea and vomiting.  Genitourinary:  Negative for frequency and urgency.  Musculoskeletal:   Negative for myalgias.  Neurological:  Negative for dizziness, weakness and headaches.  Psychiatric/Behavioral:  Negative for depression and suicidal ideas.        Objective:     BP 126/80   Pulse 66   Ht 6\' 3"  (1.905 m)   Wt 216 lb (98 kg)   SpO2 97%   BMI 27.00 kg/m  BP Readings from Last 3 Encounters:  06/19/22 126/80  06/14/21 113/78  05/12/21 108/72     Physical Exam Constitutional:      Appearance: Normal appearance.  HENT:     Right Ear: Tympanic membrane, ear canal and external ear normal.     Left Ear: Tympanic membrane, ear canal and external ear normal.  Eyes:     Extraocular Movements: Extraocular movements intact.     Pupils: Pupils are equal, round, and reactive to light.  Cardiovascular:     Rate and Rhythm: Normal rate and regular rhythm.     Pulses: Normal pulses.     Heart sounds: Normal heart sounds.  Pulmonary:     Effort: Pulmonary effort is normal.     Breath sounds: Normal breath sounds.  Abdominal:     General: Abdomen is flat. Bowel sounds are normal.     Palpations: Abdomen is soft.  Musculoskeletal:        General: Normal range of motion.  Skin:    General: Skin is warm and dry.  Neurological:  Mental Status: He is alert.  Psychiatric:        Mood and Affect: Mood normal.        Behavior: Behavior normal.        Thought Content: Thought content normal.        Judgment: Judgment normal.         Assessment & Plan:    Routine Health Maintenance and Physical Exam  Health Maintenance  Topic Date Due   COVID-19 Vaccine (1) Never done   HIV Screening  Never done   Hepatitis C Screening: USPSTF Recommendation to screen - Ages 44-79 yo.  Never done   Zoster (Shingles) Vaccine (1 of 2) Never done   Flu Shot  09/07/2022   DTaP/Tdap/Td vaccine (3 - Td or Tdap) 04/20/2026   Colon Cancer Screening  05/13/2026   HPV Vaccine  Aged Out   1. Wellness examination Routine HCM labs ordered. Will obtain labs today and update patient with  results.  Review of PMH, FH, SH, medications and HM performed.  Recommend healthy diet.  Recommend approximately 150 minutes/week of moderate intensity exercise. Recommend regular dental and vision exams. Always use seatbelt/lap and shoulder restraints. Recommend using smoke alarms and checking batteries at least twice a year. Recommend using sunscreen when outside. Discussed immunization recommendations. Patient reports he has had his Shingrix vaccines, will update chart. Vaccines are UTD.  Discussed colon cancer screening recommendations and options.  Discussed routine EGDs with biopsies every 5 years, due to history of Barrett's esophagus.   The natural history of prostate cancer and ongoing controversy regarding screening and potential treatment outcomes of prostate cancer has been discussed with the patient. The meaning of a false positive PSA and a false negative PSA has been discussed. He indicates understanding of the limitations of this screening test and wishes not to proceed with screening PSA testing.  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 He also has a history of low testosterone and would like to check his testosterone levels at this time. Lab order placed. Reports he was taking Viagra in the past with no adverse side effects. He reports he normally takes a half tablet with adequate response. Patient is not taking nitrates, no additional contraindications presents. Educated patient when to take medication and possible side effects. Will prescribe Viagra for as needed use.  - sildenafil (VIAGRA) 25 MG tablet; Take 1 tablets (25 mg total)  by mouth daily as needed for erectile dysfunction.  Dispense: 90 tablet; Refill: 1  3. Anxiety with flying Patient reports flying to [place] for his 62th birthday and is nervous about the upcoming flight. He reports he has issues with being a confined space for multiple hours at one time and reports having issues with anxiety and sleeping when he is out of the country. PDMP reviewed, no red flags. Will prescribe short-term use of Xanax for upcoming travel plans. Advised patient about side effects, when to take medication, and concerns with dependence/addiction with long-term use.  - ALPRAZolam (XANAX) 0.25 MG tablet; Take 1 tablet (0.25 mg total) by mouth at bedtime as needed for anxiety.  Dispense: 10 tablet; Refill: 0   Return in  about 6 months (around 12/20/2022) for HTN follow-up.     Alyson Reedy, FNP

## 2022-06-23 LAB — TESTOSTERONE,FREE AND TOTAL
Testosterone, Free: 25.7 pg/mL — ABNORMAL HIGH (ref 7.2–24.0)
Testosterone: 1443 ng/dL — ABNORMAL HIGH (ref 264–916)

## 2022-06-23 LAB — PSA TOTAL (REFLEX TO FREE): Prostate Specific Ag, Serum: 0.9 ng/mL (ref 0.0–4.0)

## 2022-07-01 IMAGING — CT CT CARDIAC CORONARY ARTERY CALCIUM SCORE
3 series · 14 of 20 positions shown, 16 images · non-contrast
Comparison: None.

CLINICAL DATA: Hyperlipidemia

EXAM:
CT CARDIAC CORONARY ARTERY CALCIUM SCORE
TECHNIQUE: Non-contrast imaging through the heart was performed using
prospective ECG gating. Image post processing was performed on an
independent workstation, allowing for quantitative analysis of the
heart and coronary arteries. Note that this exam targets the heart
and the chest was not imaged in its entirety.

[Series 2: calcium scoring 2.00 qr36 bestdiast 73% hrt calciu · axial · 0.42mm/px · z∈[+1806,+1890]mm · 4 of 70 slices shown]
[im 14/70  vessel]
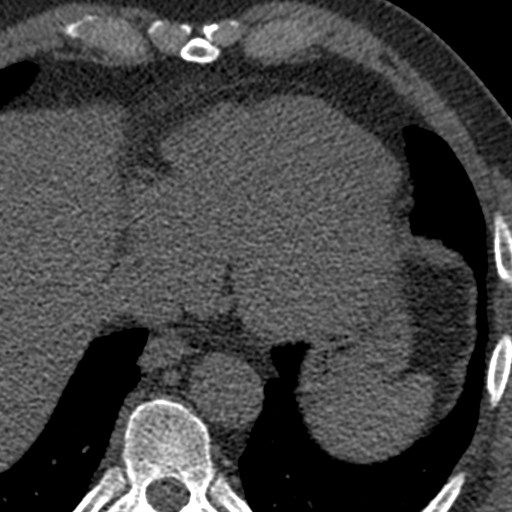
[im 28/70  vessel]
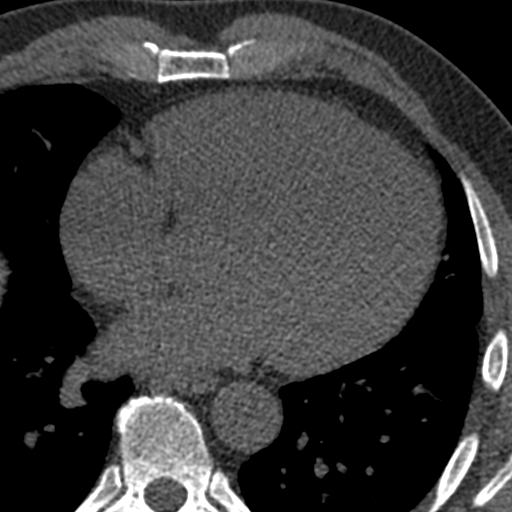
[im 42/70  vessel]
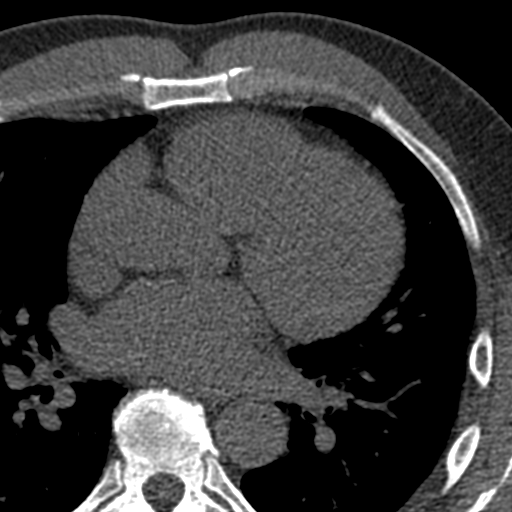
[im 56/70  vessel]
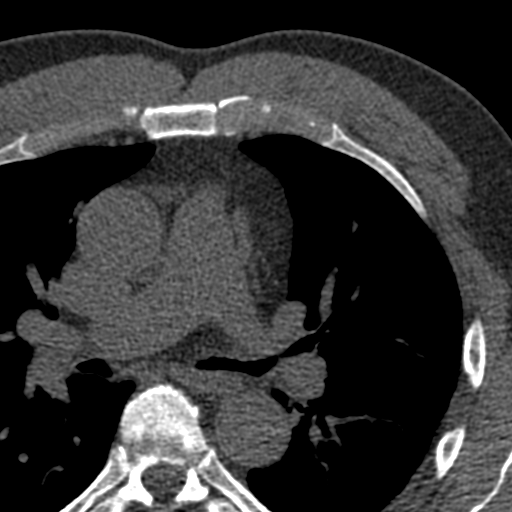

[Series 3: calcium scoring 2.00 br40 bestdiast 73% axial · axial · 0.64mm/px · z∈[+1802,+1894]mm · 5 of 70 slices shown, 7 images]
[im 12/70  vessel]
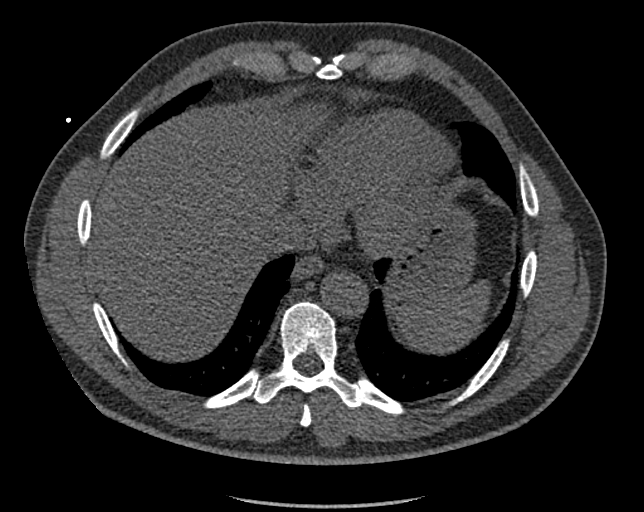
[im 12/70  lung]
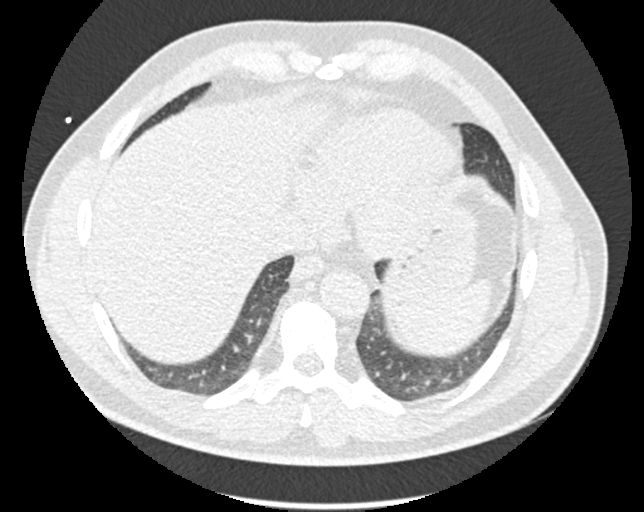
[im 24/70  vessel]
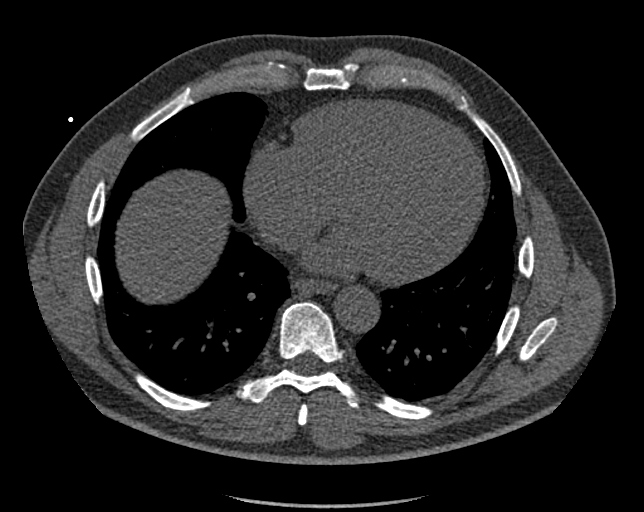
[im 35/70  vessel]
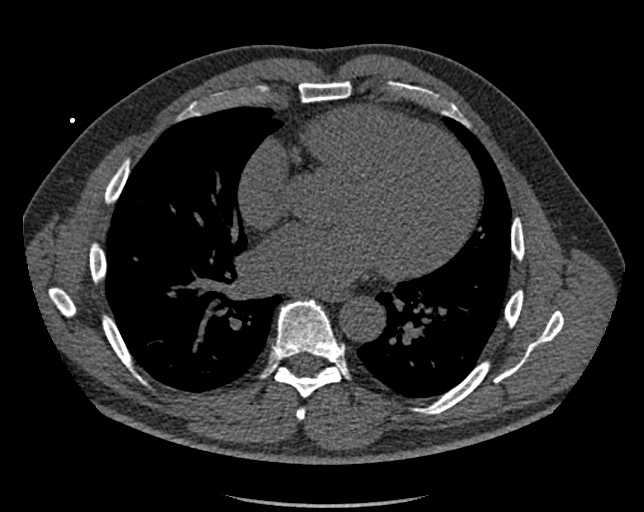
[im 47/70  vessel]
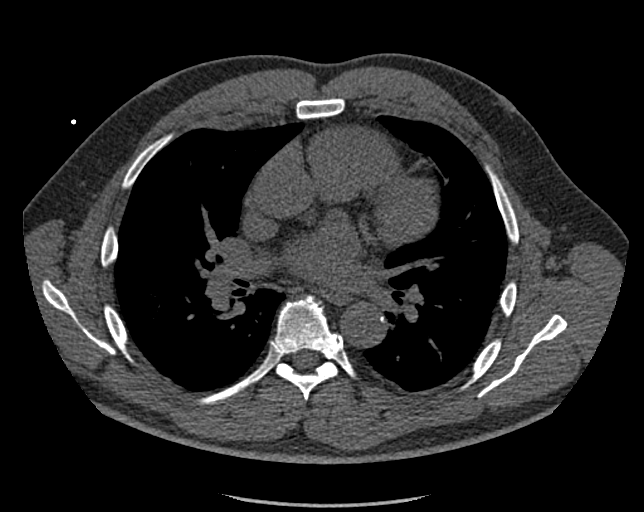
[im 58/70  vessel]
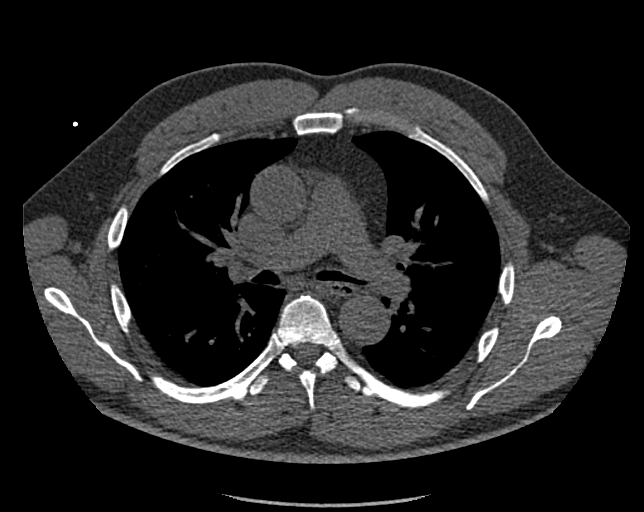
[im 58/70  lung]
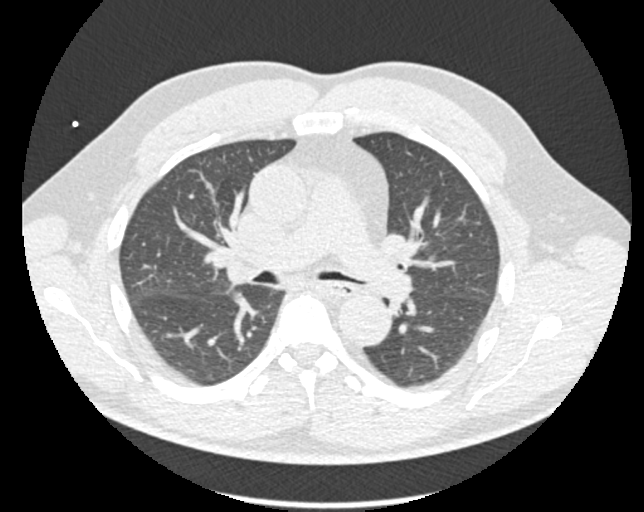

[Series 9: calcium scoring 2.00 br60 bestdiast 73% axial · axial · 0.64mm/px · z∈[+1802,+1894]mm · 5 of 70 slices shown]
[im 12/70  vessel]
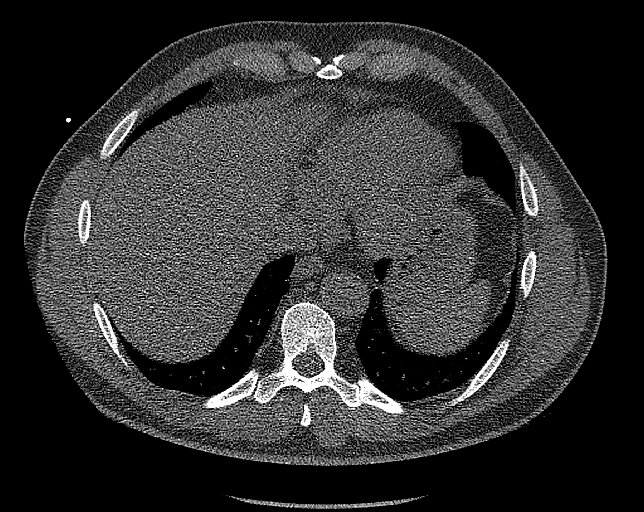
[im 24/70  vessel]
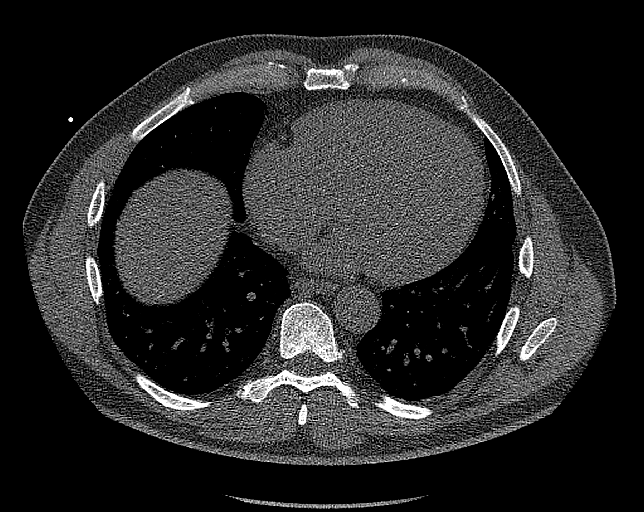
[im 35/70  vessel]
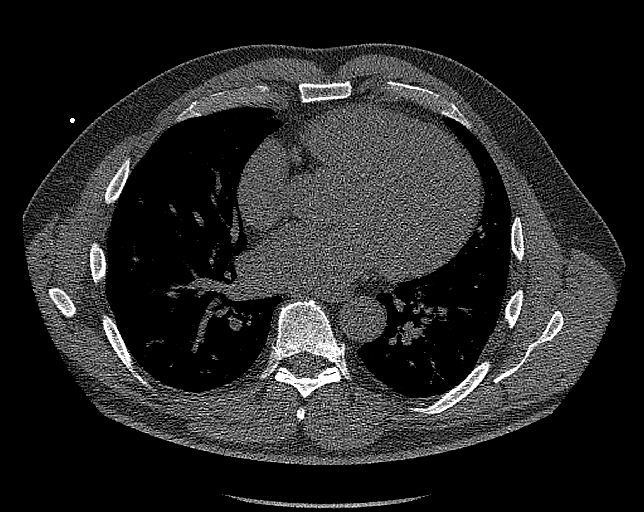
[im 47/70  vessel]
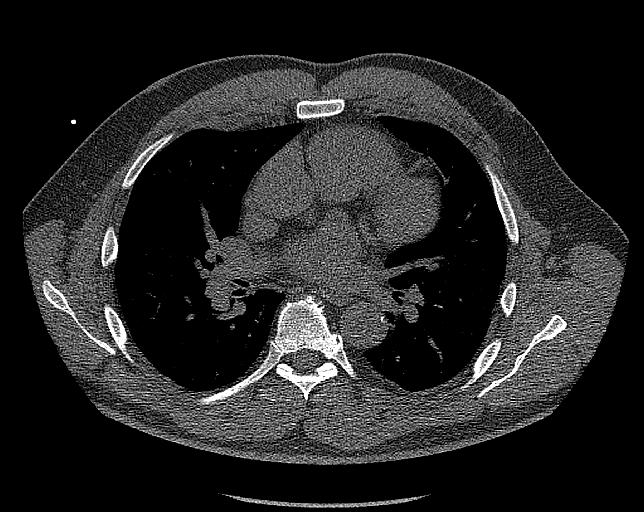
[im 58/70  vessel]
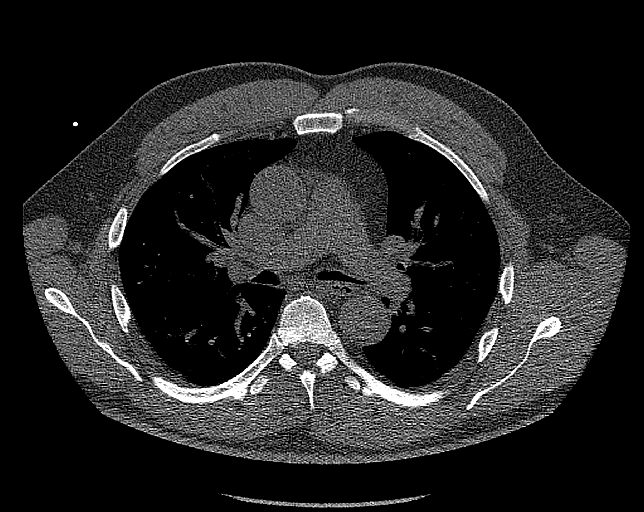

[14 of 20 positions shown; findings below may reference images not displayed]

FINDINGS: CORONARY CALCIUM SCORES:

Left Main: 0

LAD: 0

LCx: 0

RCA: 0

Total Agatston Score: 0

[HOSPITAL] percentile: 0

AORTA MEASUREMENTS:

Ascending Aorta: 33 mm

Descending Aorta: 31 mm

OTHER FINDINGS:

Heart is normal size. Aorta normal caliber. No adenopathy. Scattered
calcifications in the aortic arch and descending thoracic aorta. No
confluent opacities or effusions. Insert for abdomen Chest wall soft
tissues are unremarkable. No acute bony abnormality.
IMPRESSION: No visible coronary artery calcifications. Total coronary calcium
score of 0.

Scattered aortic calcifications.

## 2022-11-10 DIAGNOSIS — Z13228 Encounter for screening for other metabolic disorders: Secondary | ICD-10-CM | POA: Diagnosis not present

## 2022-11-10 DIAGNOSIS — K22719 Barrett's esophagus with dysplasia, unspecified: Secondary | ICD-10-CM | POA: Diagnosis not present

## 2022-11-10 DIAGNOSIS — I10 Essential (primary) hypertension: Secondary | ICD-10-CM | POA: Diagnosis not present

## 2022-11-10 DIAGNOSIS — Z0000 Encounter for general adult medical examination without abnormal findings: Secondary | ICD-10-CM | POA: Diagnosis not present

## 2022-11-10 DIAGNOSIS — Z130 Encounter for screening for diseases of the blood and blood-forming organs and certain disorders involving the immune mechanism: Secondary | ICD-10-CM | POA: Diagnosis not present

## 2022-11-10 DIAGNOSIS — Z1321 Encounter for screening for nutritional disorder: Secondary | ICD-10-CM | POA: Diagnosis not present

## 2022-11-10 DIAGNOSIS — Z1330 Encounter for screening examination for mental health and behavioral disorders, unspecified: Secondary | ICD-10-CM | POA: Diagnosis not present

## 2022-11-10 DIAGNOSIS — Z1329 Encounter for screening for other suspected endocrine disorder: Secondary | ICD-10-CM | POA: Diagnosis not present

## 2022-11-29 DIAGNOSIS — L905 Scar conditions and fibrosis of skin: Secondary | ICD-10-CM | POA: Diagnosis not present

## 2022-11-29 DIAGNOSIS — X32XXXD Exposure to sunlight, subsequent encounter: Secondary | ICD-10-CM | POA: Diagnosis not present

## 2022-11-29 DIAGNOSIS — L570 Actinic keratosis: Secondary | ICD-10-CM | POA: Diagnosis not present

## 2022-12-20 ENCOUNTER — Ambulatory Visit (HOSPITAL_BASED_OUTPATIENT_CLINIC_OR_DEPARTMENT_OTHER): Payer: BC Managed Care – PPO | Admitting: Family Medicine

## 2022-12-28 ENCOUNTER — Encounter (HOSPITAL_BASED_OUTPATIENT_CLINIC_OR_DEPARTMENT_OTHER): Payer: Self-pay | Admitting: Family Medicine

## 2023-01-06 ENCOUNTER — Other Ambulatory Visit (HOSPITAL_BASED_OUTPATIENT_CLINIC_OR_DEPARTMENT_OTHER): Payer: Self-pay | Admitting: Family Medicine

## 2023-01-06 DIAGNOSIS — I10 Essential (primary) hypertension: Secondary | ICD-10-CM
